# Patient Record
Sex: Female | Born: 1960 | Race: White | Hispanic: No | Marital: Married | Smoking: Never smoker
Health system: Southern US, Community
[De-identification: ages and names within clinical notes are randomized; demographics above are authoritative.]

## PROBLEM LIST (undated history)

## (undated) DIAGNOSIS — F419 Anxiety disorder, unspecified: Secondary | ICD-10-CM

## (undated) DIAGNOSIS — C4442 Squamous cell carcinoma of skin of scalp and neck: Secondary | ICD-10-CM

## (undated) DIAGNOSIS — C4491 Basal cell carcinoma of skin, unspecified: Secondary | ICD-10-CM

## (undated) DIAGNOSIS — Z803 Family history of malignant neoplasm of breast: Secondary | ICD-10-CM

## (undated) DIAGNOSIS — R87619 Unspecified abnormal cytological findings in specimens from cervix uteri: Secondary | ICD-10-CM

## (undated) DIAGNOSIS — C439 Malignant melanoma of skin, unspecified: Secondary | ICD-10-CM

## (undated) HISTORY — PX: SKIN CANCER EXCISION: SHX779

## (undated) HISTORY — PX: COLONOSCOPY: SHX174

## (undated) HISTORY — PX: ESOPHAGOGASTRODUODENOSCOPY ENDOSCOPY: SHX5814

## (undated) HISTORY — DX: Squamous cell carcinoma of skin of scalp and neck: C44.42

## (undated) HISTORY — DX: Malignant melanoma of skin, unspecified: C43.9

## (undated) HISTORY — DX: Basal cell carcinoma of skin, unspecified: C44.91

## (undated) HISTORY — PX: WISDOM TOOTH EXTRACTION: SHX21

## (undated) HISTORY — DX: Family history of malignant neoplasm of breast: Z80.3

## (undated) HISTORY — DX: Unspecified abnormal cytological findings in specimens from cervix uteri: R87.619

## (undated) HISTORY — PX: BREAST BIOPSY: SHX20

## (undated) HISTORY — PX: COLPOSCOPY: SHX161

## (undated) HISTORY — DX: Anxiety disorder, unspecified: F41.9

---

## 2004-08-30 ENCOUNTER — Ambulatory Visit: Payer: Self-pay | Admitting: Unknown Physician Specialty

## 2004-09-28 ENCOUNTER — Ambulatory Visit: Payer: Self-pay | Admitting: Gastroenterology

## 2005-09-01 ENCOUNTER — Ambulatory Visit: Payer: Self-pay | Admitting: Unknown Physician Specialty

## 2006-10-16 ENCOUNTER — Ambulatory Visit: Payer: Self-pay | Admitting: Unknown Physician Specialty

## 2007-10-30 ENCOUNTER — Ambulatory Visit: Payer: Self-pay | Admitting: Unknown Physician Specialty

## 2008-12-04 ENCOUNTER — Ambulatory Visit: Payer: Self-pay | Admitting: Unknown Physician Specialty

## 2009-12-23 ENCOUNTER — Ambulatory Visit: Payer: Self-pay | Admitting: Unknown Physician Specialty

## 2013-07-16 ENCOUNTER — Emergency Department: Payer: Self-pay | Admitting: Emergency Medicine

## 2013-07-16 LAB — COMPREHENSIVE METABOLIC PANEL
ALT: 15 U/L (ref 12–78)
Albumin: 4 g/dL (ref 3.4–5.0)
Alkaline Phosphatase: 82 U/L
Anion Gap: 5 — ABNORMAL LOW (ref 7–16)
BILIRUBIN TOTAL: 0.5 mg/dL (ref 0.2–1.0)
BUN: 14 mg/dL (ref 7–18)
CREATININE: 0.89 mg/dL (ref 0.60–1.30)
Calcium, Total: 9.4 mg/dL (ref 8.5–10.1)
Chloride: 107 mmol/L (ref 98–107)
Co2: 27 mmol/L (ref 21–32)
EGFR (Non-African Amer.): >60
Glucose: 112 mg/dL — ABNORMAL HIGH (ref 65–99)
OSMOLALITY: 279 (ref 275–301)
POTASSIUM: 3.8 mmol/L (ref 3.5–5.1)
SGOT(AST): 15 U/L (ref 15–37)
SODIUM: 139 mmol/L (ref 136–145)
TOTAL PROTEIN: 7.3 g/dL (ref 6.4–8.2)

## 2013-07-16 LAB — CBC WITH DIFFERENTIAL/PLATELET
BASOS ABS: 0.1 10*3/uL (ref 0.0–0.1)
BASOS PCT: 0.4 %
EOS ABS: 0.1 10*3/uL (ref 0.0–0.7)
Eosinophil %: 0.5 %
HCT: 44.4 % (ref 35.0–47.0)
HGB: 14.6 g/dL (ref 12.0–16.0)
Lymphocyte #: 2 10*3/uL (ref 1.0–3.6)
Lymphocyte %: 15.1 %
MCH: 30.2 pg (ref 26.0–34.0)
MCHC: 32.9 g/dL (ref 32.0–36.0)
MCV: 92 fL (ref 80–100)
Monocyte #: 1.2 x10 3/mm — ABNORMAL HIGH (ref 0.2–0.9)
Monocyte %: 9.2 %
Neutrophil #: 9.8 10*3/uL — ABNORMAL HIGH (ref 1.4–6.5)
Neutrophil %: 74.8 %
Platelet: 205 10*3/uL (ref 150–440)
RBC: 4.84 10*6/uL (ref 3.80–5.20)
RDW: 13.6 % (ref 11.5–14.5)
WBC: 13.1 10*3/uL — AB (ref 3.6–11.0)

## 2013-07-16 LAB — URINALYSIS, COMPLETE
Bacteria: NONE SEEN
Bilirubin,UR: NEGATIVE
Blood: NEGATIVE
Glucose,UR: NEGATIVE mg/dL (ref 0–75)
KETONE: NEGATIVE
Leukocyte Esterase: NEGATIVE
NITRITE: NEGATIVE
Ph: 6 (ref 4.5–8.0)
Protein: NEGATIVE
RBC,UR: <1 /HPF (ref 0–5)
SPECIFIC GRAVITY: 1.01 (ref 1.003–1.030)
Squamous Epithelial: 1
WBC UR: NONE SEEN /HPF (ref 0–5)

## 2013-07-16 LAB — LIPASE, BLOOD: Lipase: 194 U/L (ref 73–393)

## 2013-07-16 LAB — TROPONIN I: Troponin-I: <0.02 ng/mL

## 2013-07-17 ENCOUNTER — Emergency Department: Payer: Self-pay | Admitting: Emergency Medicine

## 2013-07-17 LAB — CBC WITH DIFFERENTIAL/PLATELET
BASOS ABS: 0.1 10*3/uL (ref 0.0–0.1)
BASOS PCT: 0.6 %
EOS PCT: 0.3 %
Eosinophil #: 0 10*3/uL (ref 0.0–0.7)
HCT: 46.6 % (ref 35.0–47.0)
HGB: 15.3 g/dL (ref 12.0–16.0)
LYMPHS ABS: 2.5 10*3/uL (ref 1.0–3.6)
LYMPHS PCT: 26.8 %
MCH: 30.6 pg (ref 26.0–34.0)
MCHC: 32.7 g/dL (ref 32.0–36.0)
MCV: 94 fL (ref 80–100)
MONOS PCT: 10 %
Monocyte #: 0.9 x10 3/mm (ref 0.2–0.9)
NEUTROS ABS: 5.8 10*3/uL (ref 1.4–6.5)
Neutrophil %: 62.3 %
PLATELETS: 190 10*3/uL (ref 150–440)
RBC: 4.98 10*6/uL (ref 3.80–5.20)
RDW: 13.8 % (ref 11.5–14.5)
WBC: 9.2 10*3/uL (ref 3.6–11.0)

## 2013-07-17 LAB — COMPREHENSIVE METABOLIC PANEL
Albumin: 3.9 g/dL (ref 3.4–5.0)
Alkaline Phosphatase: 76 U/L
Anion Gap: 8 (ref 7–16)
BUN: 9 mg/dL (ref 7–18)
Bilirubin,Total: 0.9 mg/dL (ref 0.2–1.0)
Calcium, Total: 9.1 mg/dL (ref 8.5–10.1)
Chloride: 109 mmol/L — ABNORMAL HIGH (ref 98–107)
Co2: 22 mmol/L (ref 21–32)
Creatinine: 0.8 mg/dL (ref 0.60–1.30)
EGFR (African American): >60
Glucose: 80 mg/dL (ref 65–99)
OSMOLALITY: 275 (ref 275–301)
Potassium: 3.8 mmol/L (ref 3.5–5.1)
SGOT(AST): 27 U/L (ref 15–37)
SGPT (ALT): 13 U/L (ref 12–78)
SODIUM: 139 mmol/L (ref 136–145)
Total Protein: 7.4 g/dL (ref 6.4–8.2)

## 2013-07-17 LAB — LIPASE, BLOOD: Lipase: 134 U/L (ref 73–393)

## 2013-07-17 LAB — TROPONIN I

## 2013-07-18 ENCOUNTER — Emergency Department: Payer: Self-pay | Admitting: Emergency Medicine

## 2013-07-18 LAB — CBC
HCT: 41.8 % (ref 35.0–47.0)
HGB: 14.5 g/dL (ref 12.0–16.0)
MCH: 32.3 pg (ref 26.0–34.0)
MCHC: 34.7 g/dL (ref 32.0–36.0)
MCV: 93 fL (ref 80–100)
PLATELETS: 192 10*3/uL (ref 150–440)
RBC: 4.49 10*6/uL (ref 3.80–5.20)
RDW: 13.7 % (ref 11.5–14.5)
WBC: 7.5 10*3/uL (ref 3.6–11.0)

## 2013-07-18 LAB — COMPREHENSIVE METABOLIC PANEL
ALBUMIN: 3.8 g/dL (ref 3.4–5.0)
ALT: 16 U/L (ref 12–78)
Alkaline Phosphatase: 73 U/L
Anion Gap: 6 — ABNORMAL LOW (ref 7–16)
BILIRUBIN TOTAL: 0.9 mg/dL (ref 0.2–1.0)
BUN: 11 mg/dL (ref 7–18)
Calcium, Total: 8.5 mg/dL (ref 8.5–10.1)
Chloride: 106 mmol/L (ref 98–107)
Co2: 28 mmol/L (ref 21–32)
Creatinine: 1.05 mg/dL (ref 0.60–1.30)
EGFR (African American): >60
EGFR (Non-African Amer.): >60
GLUCOSE: 71 mg/dL (ref 65–99)
OSMOLALITY: 277 (ref 275–301)
POTASSIUM: 4 mmol/L (ref 3.5–5.1)
SGOT(AST): 23 U/L (ref 15–37)
Sodium: 140 mmol/L (ref 136–145)
Total Protein: 7 g/dL (ref 6.4–8.2)

## 2013-07-18 LAB — LIPASE, BLOOD: Lipase: 163 U/L (ref 73–393)

## 2013-07-24 ENCOUNTER — Ambulatory Visit: Payer: Self-pay | Admitting: Gastroenterology

## 2013-08-05 ENCOUNTER — Ambulatory Visit: Payer: Self-pay | Admitting: Gastroenterology

## 2016-03-27 ENCOUNTER — Ambulatory Visit (INDEPENDENT_AMBULATORY_CARE_PROVIDER_SITE_OTHER): Payer: BC Managed Care – PPO | Admitting: Certified Nurse Midwife

## 2016-03-27 ENCOUNTER — Encounter: Payer: Self-pay | Admitting: Certified Nurse Midwife

## 2016-03-27 VITALS — BP 110/70 | HR 64 | Ht 65.0 in | Wt 173.0 lb

## 2016-03-27 DIAGNOSIS — Z13220 Encounter for screening for lipoid disorders: Secondary | ICD-10-CM | POA: Diagnosis not present

## 2016-03-27 DIAGNOSIS — C439 Malignant melanoma of skin, unspecified: Secondary | ICD-10-CM | POA: Insufficient documentation

## 2016-03-27 DIAGNOSIS — N951 Menopausal and female climacteric states: Secondary | ICD-10-CM | POA: Insufficient documentation

## 2016-03-27 DIAGNOSIS — Z124 Encounter for screening for malignant neoplasm of cervix: Secondary | ICD-10-CM

## 2016-03-27 DIAGNOSIS — Z01419 Encounter for gynecological examination (general) (routine) without abnormal findings: Secondary | ICD-10-CM

## 2016-03-27 DIAGNOSIS — F419 Anxiety disorder, unspecified: Secondary | ICD-10-CM | POA: Insufficient documentation

## 2016-03-27 DIAGNOSIS — C4491 Basal cell carcinoma of skin, unspecified: Secondary | ICD-10-CM | POA: Insufficient documentation

## 2016-03-27 MED ORDER — ALPRAZOLAM 0.25 MG PO TABS: 0.2500 mg | ORAL_TABLET | Freq: Three times a day (TID) | ORAL | 0 refills | Status: DC | PRN

## 2016-03-27 MED ORDER — ESCITALOPRAM OXALATE 5 MG PO TABS: 5.0000 mg | ORAL_TABLET | Freq: Every day | ORAL | 3 refills | Status: DC

## 2016-03-27 NOTE — Progress Notes (Addendum)
Gynecology Annual Exam  PCP: No PCP Per Patient  Chief Complaint:  Chief Complaint  Patient presents with  . Gynecologic Exam    History of Present Illness: Patient is a 55 y.o. [id number] presents for annual exam. The patient has no gyn complaints today. Her hot flashes as well as her anxiety are treated with a 63mm Lexapro. She rarely takes Xanax.  Her menses are infrequent, about every 3 months, and last 1-2 days. LMP: Patient's last menstrual period was 11/24/2015 (approximate).  The patient is sexually active. She currently uses Condoms for contraception. Her last annual exam was 01/28/2015. .Marland KitchenHer  last pap: was 01/20/2014 and was NIL. and last mammogram: 01/28/2015 was negative  The patient has regular exercise: has just recently started back exercising after a 7 mos hiatus.. She has gained 11# in the last year. There is a family history of breast cancer in her mother at age 56 Genetic testing has not been done. She does do self breast exams monthly. She does not smoke. She drinks socially. She does not use illicit drugs.She does get adequate calcium in her diet. Last cholesterol panel was drawn 2016 and total cholesterol and LDL was borderline. She desires testing today    Review of Systems: Review of Systems  Constitutional: Negative for chills, fever and weight loss.       Positive for weight gain and occasional hot flashes  HENT: Negative for congestion, sinus pain and sore throat.   Eyes: Negative for blurred vision and pain.  Respiratory: Negative for hemoptysis, shortness of breath and wheezing.   Cardiovascular: Negative for chest pain, palpitations and leg swelling.  Gastrointestinal: Negative for abdominal pain, blood in stool, diarrhea, heartburn, nausea and vomiting.  Genitourinary: Negative for dysuria, frequency, hematuria and urgency.  Musculoskeletal: Negative for back pain, joint pain and myalgias.  Skin: Negative for itching and rash.  Neurological: Negative  for dizziness, tingling and headaches.  Endo/Heme/Allergies: Negative for environmental allergies and polydipsia. Does not bruise/bleed easily.       Negative for hirsutism   Psychiatric/Behavioral: Negative for depression. The patient is not nervous/anxious and does not have insomnia.      Past Medical History:  Past Medical History:  Diagnosis Date  . Abnormal Pap smear of cervix    +HPV  . Anxiety   . Basal cell carcinoma    chest  . Family history of breast cancer   . [place]    [place]    Past Surgical History:  Past Surgical History:  Procedure Laterality Date  . COLONOSCOPY  2006/2015  . COLPOSCOPY  2001/2007  . ESOPHAGOGASTRODUODENOSCOPY ENDOSCOPY     for abd pain and N/V  . WISDOM TOOTH EXTRACTION     top two    Gynecologic History:  History of abnormal Pap smears in 2001 and 2007. Colposcopies revealed HPV changes.  Obstetric History: [id number] Family History:  Family History  Problem Relation Age of Onset  . Breast cancer Mother 717   met to liver/pancreas  . Diabetes Father   . Heart failure Father   . Stroke Maternal Uncle   . Alzheimer's disease Maternal Grandmother     Social History:  Social History   Social History  . Marital status: Married    Spouse name: N/A  . Number of children: 2  . Years of education: N/A   Occupational History  . Bookkeeper    Social History Main Topics  . Smoking status: Never Smoker  .  Smokeless tobacco: Never Used  . Alcohol use Yes  . Drug use: No  . Sexual activity: Yes   Other Topics Concern  . Not on file   Social History Narrative  . No narrative on file    Allergies:  No Known Allergies  Medications: Prior to Admission medications   Medication Sig Start Date End Date Taking? Authorizing Provider  escitalopram (LEXAPRO) 5 MG tablet Take 5 mg by mouth daily.   Yes Historical Provider, MD  ALPRAZolam (XANAX) 0.25 MG tablet Take 0.25 mg by mouth 3 (three) times daily as needed for  anxiety.    Historical Provider, MD    Physical Exam Vitals: Blood pressure 110/70, pulse 64, height 5' 5"  (1.651 m), weight 173 lb (78.5 kg), last menstrual period 11/24/2015.  General: NAD HEENT: normocephalic, anicteric Thyroid: no enlargement, no palpable nodules Pulmonary: No increased work of breathing, CTAB Cardiovascular: RRR without murmur  Breast: Breast symmetrical, no tenderness, no palpable nodules or masses, no skin or nipple retraction present, no nipple discharge.  No axillary or infraclavicular, or supraclavicular lymphadenopathy. Abdomen:, soft, non-tender, non-distended.  Umbilicus without lesions.  No hepatomegaly,  or masses palpable. No evidence of hernia  Genitourinary:  External: Normal external female genitalia.  Normal urethral meatus, normal  Bartholin's and Skene's glands.    Vagina: Normal vaginal mucosa, no evidence of prolapse.    Cervix: Grossly normal in appearance, no bleeding  Uterus: AV, NSSC, mobile, normal contour.  No CMT  Adnexa: ovaries non-enlarged, no adnexal masses  Rectal: deferred  Lymphatic: no evidence of inguinal lymphadenopathy Extremities: no edema, erythema, or tenderness Neurologic: Grossly intact Psychiatric: mood appropriate, affect full  Assessment and Plan 1. Encounter for gynecological examination (general) (routine) without abnormal findings  Discussed perimenopausal bleeding pattern being normal. Advised to continue contraception until no menses x1 year. m. .  2. Perimenopausal vasomotor symptoms Continue Lexapro for hot flashes  3. Anxiety Continue Lexapro  4. Screening for hyperlipidemia  - Lipid panel  5. Screening for cervical cancer  - Pap IG and HPV (high risk) DNA detection  6. Screening for colon cancer: up to date on colonoscopy  7. Screening for breast cancer- schedule screening mammogram. Continue annual mammograms.   Dalia Heading, CNM

## 2016-03-28 LAB — LIPID PANEL
CHOL/HDL RATIO: 3.8 ratio (ref 0.0–4.4)
Cholesterol, Total: 218 mg/dL — ABNORMAL HIGH (ref 100–199)
HDL: 57 mg/dL (ref >39)
LDL Calculated: 138 mg/dL — ABNORMAL HIGH (ref 0–99)
TRIGLYCERIDES: 113 mg/dL (ref 0–149)
VLDL Cholesterol Cal: 23 mg/dL (ref 5–40)

## 2016-03-29 LAB — PAP IG AND HPV HIGH-RISK
HPV, HIGH-RISK: NEGATIVE
PAP SMEAR COMMENT: 0

## 2016-04-10 ENCOUNTER — Encounter: Payer: Self-pay | Admitting: Certified Nurse Midwife

## 2017-03-20 ENCOUNTER — Other Ambulatory Visit: Payer: Self-pay | Admitting: Certified Nurse Midwife

## 2017-06-16 ENCOUNTER — Other Ambulatory Visit: Payer: Self-pay | Admitting: Certified Nurse Midwife

## 2017-09-14 ENCOUNTER — Other Ambulatory Visit: Payer: Self-pay | Admitting: Certified Nurse Midwife

## 2017-11-23 HISTORY — PX: MOHS SURGERY: SUR867

## 2017-12-10 ENCOUNTER — Other Ambulatory Visit: Payer: Self-pay | Admitting: Certified Nurse Midwife

## 2018-01-30 ENCOUNTER — Other Ambulatory Visit: Payer: Self-pay | Admitting: Certified Nurse Midwife

## 2018-03-12 NOTE — Progress Notes (Signed)
Gynecology Annual Exam  PCP: Patient, No Pcp Per  Chief Complaint:  Chief Complaint  Patient presents with  . Gynecologic Exam    History of Present Illness: Patient is a 58 y.o. [id number]  WF whopresents for her annual exam. The patient has no gyn complaints today. Her hot flashes as well as her anxiety are treated with a 35mm Lexapro. She rarely takes Xanax.   Her LMP was about a year ago.The patient is sexually active and is not needing contraceptive since she is menopausal.  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 Her past medical history is significant for melanoma and basal cell carcinoma also.  Her  last pap: was 03/27/2016 and was NIL/neg HRHPV,. and last mammogram: 01/28/2015 was negative  She had a colonoscopy 08/05/2013 that was normal and her next one is due 2025.  The patient has regular exercise: has just recently started back doing yoga..  There is a family history of breast cancer in her mother at age 58 Genetic testing has not been done. She does do self breast exams monthly. Has had intermittent pain in the upper outer right breast/ chest. She does not smoke. She drinks socially. She does not use illicit drugs.She does get adequate calcium in her diet. Last cholesterol panel was drawn 2018 and total cholesterol and LDL were borderline. She desires repeat testing today.    Review of Systems: Review of Systems  Constitutional: Negative for chills, fever and weight loss.  HENT: Negative for congestion, sinus pain and sore throat.   Eyes: Negative for blurred vision and pain.  Respiratory: Negative for hemoptysis, shortness of breath and wheezing.   Cardiovascular: Negative for chest pain, palpitations and leg swelling.  Gastrointestinal: Negative for abdominal pain, blood in stool, diarrhea, heartburn, nausea and vomiting.  Genitourinary: Negative for dysuria, frequency, hematuria and urgency.    Musculoskeletal: Negative for back pain, joint pain and myalgias.  Skin: Negative for itching and rash.  Neurological: Negative for dizziness, tingling and headaches.  Endo/Heme/Allergies: Negative for environmental allergies and polydipsia. Does not bruise/bleed easily.       Positive for hot flashes   Psychiatric/Behavioral: Negative for depression. The patient is not nervous/anxious and does not have insomnia.      Past Medical History:  Past Medical History:  Diagnosis Date  . Abnormal Pap smear of cervix    +HPV  . Anxiety   . Basal cell carcinoma    Chest  . Family history of breast cancer   . [place]    [place]  . 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 COLONOSCOPY  2006/2015  . COLPOSCOPY  2001/2007  . ESOPHAGOGASTRODUODENOSCOPY ENDOSCOPY     for abd pain and N/V  . MOHS SURGERY  11/2017   scalp-squamous cell cancer  . SKIN CANCER EXCISION  2012,2014,2019   Right arm, Chest and top of head   . WISDOM TOOTH EXTRACTION     top two    Gynecologic History:  History of abnormal Pap smears in 2001 and 2007. Colposcopies revealed HPV changes.  Obstetric History: [id number] Family History:  Family History  Problem Relation Age of Onset  . Breast cancer Mother 726      met to liver/pancreas  . Diabetes Father   . Heart  failure Father   . Skin cancer Father   . Stroke Maternal Uncle   . Alzheimer's disease Maternal Grandmother     Social History:  Social History   Socioeconomic History  . Marital status: Married    Spouse name: Elta Guadeloupe  . Number of children: 2  . Years of education: Not on file  . Highest education level: Not on file  Occupational History  . Occupation: Medical laboratory scientific officer  . Financial resource strain: Not on file  . Food insecurity:    Worry: Not on file    Inability: Not on file  . Transportation needs:    Medical: Not on file     Non-medical: Not on file  Tobacco Use  . Smoking status: Never Smoker  . Smokeless tobacco: Never Used  Substance and Sexual Activity  . Alcohol use: Yes  . Drug use: No  . Sexual activity: Yes    Birth control/protection: Post-menopausal  Lifestyle  . Physical activity:    Days per week: Not on file    Minutes per session: Not on file  . Stress: Not on file  Relationships  . Social connections:    Talks on phone: Not on file    Gets together: Not on file    Attends religious service: Not on file    Active member of club or organization: Not on file    Attends meetings of clubs or organizations: Not on file    Relationship status: Not on file  . Intimate partner violence:    Fear of current or ex partner: Not on file    Emotionally abused: Not on file    Physically abused: Not on file    Forced sexual activity: Not on file  Other Topics Concern  . Not on file  Social History Narrative  . Not on file    Allergies:  No Known Allergies  Medications: Prior to Admission medications   Medication Sig Start Date End Date Taking? Authorizing Provider  escitalopram (LEXAPRO) 5 MG tablet Take 5 mg by mouth daily.   Yes Historical Provider, MD  ALPRAZolam (XANAX) 0.25 MG tablet Take 0.25 mg by mouth 3 (three) times daily as needed for anxiety.    Historical Provider, MD    Physical Exam Vitals: BP 110/60   Pulse 64   Ht 5' 5"  (1.651 m)   Wt 172 lb (78 kg)   LMP 11/24/2015 (Approximate)   BMI 28.62 kg/m  General: WF in  NAD HEENT: normocephalic, anicteric Thyroid: no enlargement, no palpable nodules Pulmonary: No increased work of breathing, CTAB Cardiovascular: RRR without murmur  Breast: Breast symmetrical, no tenderness, no palpable nodules or masses, no skin or nipple retraction present, no nipple discharge.  No axillary or infraclavicular, or supraclavicular lymphadenopathy. Abdomen:, soft, non-tender, non-distended.  Umbilicus without lesions.  No hepatomegaly,   or masses palpable. No evidence of hernia  Genitourinary:  External: Normal external female genitalia.  Normal urethral meatus, normal  Bartholin's and Skene's glands.    Vagina: Normal vaginal mucosa, no evidence of prolapse.    Cervix: Grossly normal in appearance, no bleeding, stenotic  Uterus: AV, NSSC, mobile, normal contour, NT  Adnexa: ovaries non-enlarged, no adnexal masses  Rectal: deferred  Lymphatic: no evidence of inguinal lymphadenopathy Extremities: no edema, erythema, or tenderness Neurologic: Grossly intact Psychiatric: mood appropriate, affect full  Assessment and Plan 1. Encounter for gynecological examination (general) (routine) without abnormal findings   2. Menopausal vasomotor symptoms Continue Lexapro for hot flashes and  anxiety  3. Anxiety Continue Lexapro  4. Routine screening for health maintanence  - Lipid panel, hemoglobin A1C, hepatitis C antibody  5. Screening for cervical cancer  - Pap done  6. Screening for colon cancer: up to date on colonoscopy  7. Screening for breast cancer- schedule screening mammogram. Continue annual mammograms.  8) Osteoporosis prevention: Discussed calcium and vitamin D3 requirements and the role of exercise in preventing osteoporosis.  9) RTO in 1 year for annual.   Dalia Heading, CNM

## 2018-03-13 ENCOUNTER — Encounter: Payer: Self-pay | Admitting: Certified Nurse Midwife

## 2018-03-13 ENCOUNTER — Other Ambulatory Visit (HOSPITAL_COMMUNITY)
Admission: RE | Admit: 2018-03-13 | Discharge: 2018-03-13 | Disposition: A | Discharge status: Home / Self Care | Payer: BC Managed Care – PPO | Source: Ambulatory Visit | Attending: Certified Nurse Midwife | Admitting: Certified Nurse Midwife

## 2018-03-13 ENCOUNTER — Ambulatory Visit (INDEPENDENT_AMBULATORY_CARE_PROVIDER_SITE_OTHER): Payer: BC Managed Care – PPO | Admitting: Certified Nurse Midwife

## 2018-03-13 VITALS — BP 110/60 | HR 64 | Ht 65.0 in | Wt 172.0 lb

## 2018-03-13 DIAGNOSIS — Z01419 Encounter for gynecological examination (general) (routine) without abnormal findings: Secondary | ICD-10-CM | POA: Diagnosis present

## 2018-03-13 DIAGNOSIS — Z124 Encounter for screening for malignant neoplasm of cervix: Secondary | ICD-10-CM | POA: Diagnosis present

## 2018-03-13 DIAGNOSIS — Z1239 Encounter for other screening for malignant neoplasm of breast: Secondary | ICD-10-CM

## 2018-03-13 DIAGNOSIS — E785 Hyperlipidemia, unspecified: Secondary | ICD-10-CM

## 2018-03-13 DIAGNOSIS — Z131 Encounter for screening for diabetes mellitus: Secondary | ICD-10-CM

## 2018-03-13 DIAGNOSIS — Z1159 Encounter for screening for other viral diseases: Secondary | ICD-10-CM

## 2018-03-13 NOTE — Patient Instructions (Signed)
Preventing Osteoporosis, Adult  Osteoporosis is a condition that causes the bones to get weaker. With osteoporosis, the bones become thinner, and the normal spaces in bone tissue become larger. This can make the bones weak and cause them to break more easily. People who have osteoporosis are more likely to break their wrist, spine, or hip. Even a minor accident or injury can be enough to break weak bones.  Osteoporosis can occur with aging. Your body constantly replaces old bone tissue with new tissue. As you get older, you may lose bone tissue more quickly, or it may be replaced more slowly. Osteoporosis is more likely to develop if you have poor nutrition or do not get enough calcium or vitamin D. Other lifestyle factors can also play a role. By making some diet and lifestyle changes, you can help to keep your bones healthy and help to prevent osteoporosis.  What nutrition changes can be made?  Nutrition plays an important role in maintaining healthy, strong bones.  · Make sure you get enough calcium every day from food or from calcium supplements.  ? If you are age 50 or younger, aim to get 1,000 mg of calcium every day.  ? If you are older than age 50, aim to get 1,200 mg of calcium every day.  · Try to get enough vitamin D every day.  ? If you are age 70 or younger, aim to get 600 international units (IU) every day.  ? If you are older than age 70, aim to get 800 international units every day.  · Follow a healthy diet. Eat plenty of foods that contain calcium and vitamin D.  ? Calcium is in milk, cheese, yogurt, and other dairy products. Some fish and vegetables are also good sources of calcium. Many foods such as cereals and breads have had calcium added to them (are fortified). Check nutrition labels to see how much calcium is in a food or drink.  ? Foods that contain vitamin D include milk, cereals, salmon, and tuna. Your body also makes vitamin D when you are out in the sun. Bare skin exposure to the sun on  your face, arms, legs, or back for no more than 30 minutes a day, 2 times per week is more than enough. Beyond that, it is important to use sunblock to protect your skin from sunburn, which increases your risk for skin cancer.  What lifestyle changes can be made?  Making changes in your everyday life can also play an important role in preventing osteoporosis.  · Stay active and get exercise every day. Ask your health care provider what types of exercise are best for you.  · Do not use any products that contain nicotine or tobacco, such as cigarettes and e-cigarettes. If you need help quitting, ask your health care provider.  · Limit alcohol intake to no more than 1 drink a day for nonpregnant women and 2 drinks a day for men. One drink equals 12 oz of beer, 5 oz of wine, or 1½ oz of hard liquor.  Why are these changes important?  Making these nutrition and lifestyle changes can:  · Help you develop and maintain healthy, strong bones.  · Prevent loss of bone mass and the problems that are caused by that loss, such as broken bones and delayed healing.  · Make you feel better mentally and physically.  What can happen if changes are not made?  Problems that can result from osteoporosis can be very serious. These   may include:  · A higher risk of broken bones that are painful and do not heal well.  · Physical malformations, such as a collapsed spine or a hunched back.  · Problems with movement.  Where to find support  If you need help making changes to prevent osteoporosis, talk with your health care provider. You can ask for a referral to a diet and nutrition specialist (dietitian) and a physical therapist.  Where to find more information  Learn more about osteoporosis from:  · NIH Osteoporosis and Related Bone Diseases National Resource Center: www.niams.nih.gov/health_info/bone/osteoporosis/osteoporosis_ff.asp  · U.S. Office on Women’s Health:  www.womenshealth.gov/publications/our-publications/fact-sheet/osteoporosis.html  · National Osteoporosis Foundation: www.nof.org/patients/what-is-osteoporosis/  Summary  · Osteoporosis is a condition that causes weak bones that are more likely to break.  · Eating a healthy diet and making sure you get enough calcium and vitamin D can help prevent osteoporosis.  · Other ways to reduce your risk of osteoporosis include getting regular exercise and avoiding alcohol and products that contain nicotine or tobacco.  This information is not intended to replace advice given to you by your health care provider. Make sure you discuss any questions you have with your health care provider.  Document Released: 01/24/2015 Document Revised: 10/17/2016 Document Reviewed: 09/20/2015  Elsevier Interactive Patient Education © 2019 Elsevier Inc.

## 2018-03-14 LAB — HEMOGLOBIN A1C
Est. average glucose Bld gHb Est-mCnc: 108 mg/dL
HEMOGLOBIN A1C: 5.4 % (ref 4.8–5.6)

## 2018-03-14 LAB — CYTOLOGY - PAP
Adequacy: ABSENT
Diagnosis: NEGATIVE

## 2018-03-14 LAB — LIPID PANEL
CHOL/HDL RATIO: 4.3 ratio (ref 0.0–4.4)
Cholesterol, Total: 234 mg/dL — ABNORMAL HIGH (ref 100–199)
HDL: 54 mg/dL (ref >39)
LDL CALC: 152 mg/dL — AB (ref 0–99)
Triglycerides: 142 mg/dL (ref 0–149)
VLDL CHOLESTEROL CAL: 28 mg/dL (ref 5–40)

## 2018-03-14 LAB — HEPATITIS C ANTIBODY: Hep C Virus Ab: <0.1 s/co ratio (ref 0.0–0.9)

## 2018-03-15 ENCOUNTER — Encounter: Payer: Self-pay | Admitting: Certified Nurse Midwife

## 2018-03-19 ENCOUNTER — Encounter: Payer: Self-pay | Admitting: Certified Nurse Midwife

## 2018-08-08 ENCOUNTER — Ambulatory Visit
Admission: RE | Admit: 2018-08-08 | Discharge: 2018-08-08 | Disposition: A | Discharge status: Home / Self Care | Payer: BC Managed Care – PPO | Source: Ambulatory Visit | Attending: Certified Nurse Midwife | Admitting: Certified Nurse Midwife

## 2018-08-08 ENCOUNTER — Other Ambulatory Visit: Payer: Self-pay

## 2018-08-08 DIAGNOSIS — Z1239 Encounter for other screening for malignant neoplasm of breast: Secondary | ICD-10-CM

## 2018-08-08 DIAGNOSIS — Z1231 Encounter for screening mammogram for malignant neoplasm of breast: Secondary | ICD-10-CM | POA: Diagnosis present

## 2019-01-12 NOTE — Progress Notes (Signed)
Virtual Visit via Telephone Note  I connected with Mallory Davis on 01/13/19 at 10:50 AM EST by telephone and verified that I am speaking with the correct person using two identifiers.   I discussed the limitations, risks, security and privacy concerns of performing an evaluation and management service by telephone and the availability of in person appointments. I also discussed with the patient that there may be a patient responsible charge related to this service. The patient expressed understanding and agreed to proceed. Location of pt: home Location of provider: [facility] Name of [place] for history intake: Drenda Freeze    Chief Complaint  Patient presents with  . Menopause  . Anxiety    HPI:       [name] is a 58 y.o. [id number] who LMP was Patient's last menstrual period was 11/24/2015 (approximate)., presents today for Rx RF of lexapro. Was taking 5 mg for anxiety and vasomotor sx for many yrs with relief. Weaned off it this past spring and was doing well until recently. Now having anxiety, particularly with waking in AM and increased vasomotor sx. Has some overlapping depression sx but doesn't feel depressed. Just frustrated with covid. Had leftover xanax and has been taking past 12 days (althoug didn't take today because feeling better) with some relief. Restarted leftover lexapro at 10 mg dose for past 6 days and starting to have some improvement. Had leftover meds and it was hard to get office appt. Pt needs Rx RF. FH anxiety/dad is bipolar. Pt is postmenopausal. Annual due 2/21.    Past Medical History:  Diagnosis Date  . Abnormal Pap smear of cervix    +HPV  . Anxiety   . Basal cell carcinoma    Chest  . Family history of breast cancer   . Melanoma ([place])    Right arm  . 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 COLONOSCOPY  2006/2015  . COLPOSCOPY  2001/2007  .  ESOPHAGOGASTRODUODENOSCOPY ENDOSCOPY     for abd pain and N/V  . MOHS SURGERY  11/2017   scalp-squamous cell cancer  . SKIN CANCER EXCISION  2012,2014,2019   Right arm, Chest and top of head   . WISDOM TOOTH EXTRACTION     top two    Family History  Problem Relation Age of Onset  . Breast cancer Mother 30       met to liver/pancreas  . Diabetes Father   . Heart failure Father   . Skin cancer Father   . Stroke Maternal Uncle   . Alzheimer's disease Maternal Grandmother      Current Outpatient Medications:  .  escitalopram (LEXAPRO) 5 MG tablet, Take 1 tablet (5 mg total) by mouth daily. Take 1-2 tablets daily, Disp: 180 tablet, Rfl: 0 .  ALPRAZolam (XANAX) 0.25 MG tablet, Take 1 tablet (0.25 mg total) by mouth 3 (three) times daily as needed for anxiety., Disp: 30 tablet, Rfl: 0   OBJECTIVE:    Physical Exam Pulmonary:     Effort: Pulmonary effort is normal.  Neurological:     General: No focal deficit present.     Mental Status: She is alert and oriented to person, place, and time.  Psychiatric:        Mood and Affect: Mood normal.        Behavior: Behavior normal.  Thought Content: Thought content normal.        Judgment: Judgment normal.      GAD 7 : Generalized Anxiety Score 01/13/2019  Nervous, Anxious, on Edge 3  Control/stop worrying 2  Worry too much - different things 3  Trouble relaxing 2  Restless 2  Easily annoyed or irritable 2  Afraid - awful might happen 2  Total GAD 7 Score 16  Anxiety Difficulty Very difficult   Depression screen PHQ 2/9 01/13/2019  Decreased Interest 2  Down, Depressed, Hopeless 1  PHQ - 2 Score 3  Altered sleeping 2  Tired, decreased energy 1  Change in appetite 2  Feeling bad or failure about yourself  1  Trouble concentrating 2  Moving slowly or fidgety/restless 0  Suicidal thoughts 0  PHQ-9 Score 11  Difficult doing work/chores Somewhat difficult    Assessment/Plan: Anxiety - Plan: escitalopram  (LEXAPRO) 5 MG tablet, ALPRAZolam (XANAX) 0.25 MG tablet  Perimenopausal vasomotor symptoms - Plan: escitalopram (LEXAPRO) 5 MG tablet  Pt doing better after lexapro 10 mg restart. Cont 10 mg dose for now. Re-eval at 6-8 wks. If doing well, can wean down to 5 mg dose to see if controls sx, like it did in past. Rx RF eRxd. Rx RF xanax to take sparingly but prn. F/u at 2/21 annual/sooner prn.    Meds ordered this encounter  Medications  . escitalopram (LEXAPRO) 5 MG tablet    Sig: Take 1 tablet (5 mg total) by mouth daily. Take 1-2 tablets daily    Dispense:  180 tablet    Refill:  0    Order Specific Question:   Supervising Provider    Answer:   Gae Dry [id number]  . ALPRAZolam (XANAX) 0.25 MG tablet    Sig: Take 1 tablet (0.25 mg total) by mouth 3 (three) times daily as needed for anxiety.    Dispense:  30 tablet    Refill:  0    Order Specific Question:   Supervising Provider    Answer:   Gae Dry [[id number]]    I discussed the assessment and treatment plan with the patient. The patient was provided an opportunity to ask questions and all were answered. The patient agreed with the plan and demonstrated an understanding of the instructions.   The patient was advised to call back or seek an in-person evaluation if the symptoms worsen or if the condition fails to improve as anticipated.  I provided 15 minutes of non-face-to-face time during this encounter.   Alicia B. Copland, PA-C 01/13/2019 11:06 AM

## 2019-01-13 ENCOUNTER — Other Ambulatory Visit: Payer: Self-pay

## 2019-01-13 ENCOUNTER — Ambulatory Visit (INDEPENDENT_AMBULATORY_CARE_PROVIDER_SITE_OTHER): Payer: BC Managed Care – PPO | Admitting: Obstetrics and Gynecology

## 2019-01-13 ENCOUNTER — Encounter: Payer: Self-pay | Admitting: Obstetrics and Gynecology

## 2019-01-13 VITALS — Ht 65.0 in | Wt 165.0 lb

## 2019-01-13 DIAGNOSIS — N951 Menopausal and female climacteric states: Secondary | ICD-10-CM

## 2019-01-13 DIAGNOSIS — F419 Anxiety disorder, unspecified: Secondary | ICD-10-CM

## 2019-01-13 MED ORDER — ALPRAZOLAM 0.25 MG PO TABS: 0.2500 mg | ORAL_TABLET | Freq: Three times a day (TID) | ORAL | 0 refills | Status: DC | PRN

## 2019-01-13 MED ORDER — ESCITALOPRAM OXALATE 5 MG PO TABS: 5.0000 mg | ORAL_TABLET | Freq: Every day | ORAL | 0 refills | Status: DC

## 2019-01-13 NOTE — Patient Instructions (Signed)
I value your feedback and entrusting us with your care. If you get a [facility] patient survey, I would appreciate you taking the time to let us know about your experience today. Thank you!  As of January 02, 2019, your lab results will be released to your MyChart immediately, before I even have a chance to see them. Please give me time to review them and contact you if there are any abnormalities. Thank you for your patience.  

## 2019-01-14 ENCOUNTER — Ambulatory Visit: Payer: BC Managed Care – PPO | Attending: Internal Medicine

## 2019-01-14 DIAGNOSIS — Z20822 Contact with and (suspected) exposure to covid-19: Secondary | ICD-10-CM

## 2019-01-16 LAB — NOVEL CORONAVIRUS, NAA: SARS-CoV-2, NAA: NOT DETECTED

## 2019-01-20 ENCOUNTER — Telehealth: Payer: Self-pay

## 2019-01-20 NOTE — Telephone Encounter (Signed)
Pt calling stating for the last 3 nights she has been waking up at 3am and has been having trouble going back to sleep. She is wondering if she is on to high of a dose because last time she was on lexapro she was only on 5mg . Pt would like to speak with [facility] about if this is normal or if it is something else. Thank you

## 2019-01-21 NOTE — Telephone Encounter (Signed)
It could be related to the lexapro. She can either go back to the 5 mg dose now or change the time of day she takes the lexapro (I.e,  If takes in AM, change to PM or vice versa) to see if that is better for her sleep. Can also be an adjustment issue for the first month.

## 2019-03-16 NOTE — Progress Notes (Signed)
Gynecology Annual Exam  PCP: Patient, No Pcp Per  Chief Complaint:  Chief Complaint  Patient presents with  . Gynecologic Exam    menopause messed up her anxiety    History of Present Illness: Patient is a 59 y.o. [id number]  WF who presents for her annual exam. The patient has no gyn complaints today. At her last annual, her hot flashes as well as her anxiety were treated with a 72mm Lexapro and rarely Xanax. She weaned off these medications and was doing well until December when her vasomotor symptoms and anxiety worsened. She was seen by AElmo PuttCopland and started back on these medications. She reports feeling much better since then.   Her LMP was about 2 years ago.The patient is sexually active and is not needing contraceptive since she is menopausal.  Since her last annual exam was 03/13/2018, she has had no other significant changes in her health.  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 Her past medical history is also significant for melanoma and basal cell carcinoma.  Her  last pap was 03/13/2018 and was NIL,. and last mammogram: 08/08/2018 was negative  She had a colonoscopy 08/05/2013 that was normal and her next one is due 2025.  The patient has regular exercise: has just recently started back doing yoga..  There is a family history of breast cancer in her mother at age 59 Genetic testing has not been done. She does do self breast exams monthly. She does not smoke. She drinks socially. She does not use illicit drugs.She does get adequate calcium in her diet. Last cholesterol panel was drawn 2020 and total cholesterol and LDL were borderline.    Review of Systems: Review of Systems  Constitutional: Negative for chills, fever and weight loss.  HENT: Negative for congestion, sinus pain and sore throat.   Eyes: Negative for blurred vision and pain.  Respiratory: Negative for hemoptysis, shortness of breath and wheezing.   Cardiovascular: Negative  for chest pain, palpitations and leg swelling.  Gastrointestinal: Negative for abdominal pain, blood in stool, diarrhea, heartburn, nausea and vomiting.  Genitourinary: Negative for dysuria, frequency, hematuria and urgency.  Musculoskeletal: Negative for back pain, joint pain and myalgias.  Skin: Negative for itching and rash.  Neurological: Negative for dizziness, tingling and headaches.  Endo/Heme/Allergies: Negative for environmental allergies and polydipsia. Does not bruise/bleed easily.       Positive for hot flashes   Psychiatric/Behavioral: Negative for depression. The patient is nervous/anxious. The patient does not have insomnia.      Past Medical History:  Past Medical History:  Diagnosis Date  . Abnormal Pap smear of cervix    +HPV  . Anxiety   . Basal cell carcinoma    Chest  . Family history of breast cancer   . [place]anoma ([place]    Right arm  . 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 COLONOSCOPY  2006/2015  . COLPOSCOPY  2001/2007  . ESOPHAGOGASTRODUODENOSCOPY ENDOSCOPY     for abd pain and N/V  . MOHS SURGERY  11/2017   scalp-squamous cell cancer  . SKIN CANCER EXCISION  2012,2014,2019   Right arm, Chest and top of head   . WISDOM TOOTH EXTRACTION     top two    Gynecologic History:  History of abnormal Pap smears in 2001 and 2007. Colposcopies  revealed HPV changes.  Obstetric History: [id number]  Family History:  Family History  Problem Relation Age of Onset  . Breast cancer Mother 13       met to liver/pancreas  . Diabetes Father   . Heart failure Father   . Skin cancer Father   . Stroke Maternal Uncle   . Alzheimer's disease Maternal Grandmother     Social History:  Social History   Socioeconomic History  . Marital status: Married    Spouse name: Elta Guadeloupe  . Number of children: 2  . Years of education: Not on file  . Highest education level: Not on  file  Occupational History  . Occupation: Radiation protection practitioner  Tobacco Use  . Smoking status: Never Smoker  . Smokeless tobacco: Never Used  Substance and Sexual Activity  . Alcohol use: Yes  . Drug use: No  . Sexual activity: Yes    Birth control/protection: Post-menopausal  Other Topics Concern  . Not on file  Social History Narrative  . Not on file   Social Determinants of Health   Financial Resource Strain:   . Difficulty of Paying Living Expenses: Not on file  Food Insecurity:   . Worried About Charity fundraiser in the Last Year: Not on file  . Ran Out of Food in the Last Year: Not on file  Transportation Needs:   . Lack of Transportation (Medical): Not on file  . Lack of Transportation (Non-Medical): Not on file  Physical Activity:   . Days of Exercise per Week: Not on file  . Minutes of Exercise per Session: Not on file  Stress:   . Feeling of Stress : Not on file  Social Connections:   . Frequency of Communication with Friends and Family: Not on file  . Frequency of Social Gatherings with Friends and Family: Not on file  . Attends Religious Services: Not on file  . Active Member of Clubs or Organizations: Not on file  . Attends Archivist Meetings: Not on file  . Marital Status: Not on file  Intimate Partner Violence:   . Fear of Current or Ex-Partner: Not on file  . Emotionally Abused: Not on file  . Physically Abused: Not on file  . Sexually Abused: Not on file    Allergies:  No Known Allergies  Medications: Prior to Admission medications   Medication Sig Start Date End Date Taking? Authorizing Provider  escitalopram (LEXAPRO) 5 MG tablet Take 5 mg by mouth daily.   Yes Historical Provider, MD  ALPRAZolam (XANAX) 0.25 MG tablet Take 0.25 mg by mouth 3 (three) times daily as needed for anxiety.    Historical Provider, MD    Physical Exam Vitals: BP 110/70   Pulse 72   Ht 5' 5"  (1.651 m)   Wt 181 lb (82.1 kg)   LMP 11/24/2015 (Approximate)   BMI  30.12 kg/m  General: WF in  NAD HEENT: normocephalic, anicteric Thyroid: no enlargement, no palpable nodules Pulmonary: No increased work of breathing, CTAB Cardiovascular: RRR without murmur  Breast: Breast symmetrical, no tenderness, no palpable nodules or masses, no skin or nipple retraction present, no nipple discharge.  No axillary or infraclavicular, or supraclavicular lymphadenopathy. Abdomen:, soft, non-tender, non-distended.  Umbilicus without lesions.  No hepatomegaly,  or masses palpable. No evidence of hernia  Genitourinary:  External: Normal external female genitalia.  Normal urethral meatus, normal  Bartholin's and Skene's glands.    Vagina: Normal vaginal mucosa, no evidence of prolapse.    Cervix:  Grossly normal in appearance, no bleeding, stenotic  Uterus: AV, NSSC, mobile, normal contour, NT  Adnexa: ovaries non-enlarged, no adnexal masses  Rectal: deferred  Lymphatic: no evidence of inguinal lymphadenopathy Extremities: no edema, erythema, or tenderness Neurologic: Grossly intact Psychiatric: mood appropriate, affect full  GAD 7 score today 3, was 16 in December 2020 PHQ 9 score today 1, was 11 in December 2020   Assessment and Plan 1. Encounter for gynecological examination (general) (routine) without abnormal findings   2. Menopausal vasomotor symptoms Continue Lexapro for hot flashes and anxiety  3. Anxiety-has improved significantly with restarting Lexapro Continue Lexapro  4. Routine screening for health maintanence Up to date  5. Screening for cervical cancer  - Pap done thru [facility]  6. Screening for colon cancer: up to date on colonoscopy  7. Screening for breast cancer- Continue annual mammograms,with next due after 08/08/2019  8) Osteoporosis prevention: Discussed calcium and vitamin D3 requirements and the role of exercise in preventing osteoporosis.  9) RTO in 1 year for annual.   Dalia Heading, CNM

## 2019-03-17 ENCOUNTER — Encounter: Payer: Self-pay | Admitting: Certified Nurse Midwife

## 2019-03-17 ENCOUNTER — Other Ambulatory Visit: Payer: Self-pay

## 2019-03-17 ENCOUNTER — Ambulatory Visit (INDEPENDENT_AMBULATORY_CARE_PROVIDER_SITE_OTHER): Payer: BC Managed Care – PPO | Admitting: Certified Nurse Midwife

## 2019-03-17 VITALS — BP 110/70 | HR 72 | Ht 65.0 in | Wt 181.0 lb

## 2019-03-17 DIAGNOSIS — Z01419 Encounter for gynecological examination (general) (routine) without abnormal findings: Secondary | ICD-10-CM | POA: Diagnosis not present

## 2019-03-17 DIAGNOSIS — N951 Menopausal and female climacteric states: Secondary | ICD-10-CM

## 2019-03-17 DIAGNOSIS — F419 Anxiety disorder, unspecified: Secondary | ICD-10-CM | POA: Diagnosis not present

## 2019-03-17 DIAGNOSIS — Z1239 Encounter for other screening for malignant neoplasm of breast: Secondary | ICD-10-CM

## 2019-03-17 DIAGNOSIS — Z8742 Personal history of other diseases of the female genital tract: Secondary | ICD-10-CM

## 2019-03-17 DIAGNOSIS — Z124 Encounter for screening for malignant neoplasm of cervix: Secondary | ICD-10-CM

## 2019-03-17 MED ORDER — ALPRAZOLAM 0.25 MG PO TABS: 0.2500 mg | ORAL_TABLET | Freq: Three times a day (TID) | ORAL | 0 refills | Status: DC | PRN

## 2019-03-17 MED ORDER — ESCITALOPRAM OXALATE 5 MG PO TABS: 5.0000 mg | ORAL_TABLET | Freq: Every day | ORAL | 3 refills | Status: DC

## 2019-04-07 ENCOUNTER — Other Ambulatory Visit: Payer: Self-pay | Admitting: Obstetrics and Gynecology

## 2019-04-07 DIAGNOSIS — N951 Menopausal and female climacteric states: Secondary | ICD-10-CM

## 2019-04-07 DIAGNOSIS — F419 Anxiety disorder, unspecified: Secondary | ICD-10-CM

## 2019-04-07 NOTE — Telephone Encounter (Signed)
Please advise 

## 2019-04-20 NOTE — Progress Notes (Signed)
  Patient, No Pcp Per   Chief Complaint  Patient presents with  . Breast exam    hard lump left breast, no pain/tenderness    HPI:      [name] is a 58 y.o. G2P2002 who LMP was Patient's last menstrual period was 11/24/2015 (approximate)., presents today for LT breast mass, noticed last wk. Less hard and smaller the past 2 days. Mass is in BCC exc scar from about 10 yrs ago. No pain, erythema, trauma, nipple d/c. No prior breast exc in past.  Neg mammo 6/20; FH breast cancer in her mom, genetic testing not indicated. Last annual 2/21  Past Medical History:  Diagnosis Date  . Abnormal Pap smear of cervix    +HPV  . Anxiety   . Basal cell carcinoma    Chest  . Family history of breast cancer   . Melanoma ([facility])    Right arm  . 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 COLONOSCOPY  2006/2015  . COLPOSCOPY  2001/2007  . ESOPHAGOGASTRODUODENOSCOPY ENDOSCOPY     for abd pain and N/V  . MOHS SURGERY  11/2017   scalp-squamous cell cancer  . SKIN CANCER EXCISION  2012,2014,2019   Right arm, Chest and top of head   . WISDOM TOOTH EXTRACTION     top two    Family History  Problem Relation Age of Onset  . Breast cancer Mother 75       met to liver/pancreas  . Diabetes Father   . Heart failure Father   . Skin cancer Father   . Stroke Maternal Uncle   . Alzheimer's disease Maternal Grandmother     Social History   Socioeconomic History  . Marital status: Married    Spouse name: Mark  . Number of children: 2  . Years of education: Not on file  . Highest education level: Not on file  Occupational History  . Occupation: Bookkeeper  Tobacco Use  . Smoking status: Never Smoker  . Smokeless tobacco: Never Used  Substance and Sexual Activity  . Alcohol use: Yes  . Drug use: No  . Sexual activity: Yes    Birth control/protection: Post-menopausal  Other Topics Concern  . Not on file   Social History Narrative  . Not on file   Social Determinants of Health   Financial Resource Strain:   . Difficulty of Paying Living Expenses:   Food Insecurity:   . Worried About Running Out of Food in the Last Year:   . Ran Out of Food in the Last Year:   Transportation Needs:   . Lack of Transportation (Medical):   . Lack of Transportation (Non-Medical):   Physical Activity:   . Days of Exercise per Week:   . Minutes of Exercise per Session:   Stress:   . Feeling of Stress :   Social Connections:   . Frequency of Communication with Friends and Family:   . Frequency of Social Gatherings with Friends and Family:   . Attends Religious Services:   . Active Member of Clubs or Organizations:   . Attends Club or Organization Meetings:   . Marital Status:   Intimate Partner Violence:   . Fear of Current or Ex-Partner:   . Emotionally Abused:   . Physically Abused:   . Sexually Abused:     Outpatient Medications Prior to Visit  Medication Sig Dispense   Refill  . ALPRAZolam (XANAX) 0.25 MG tablet Take 1 tablet (0.25 mg total) by mouth 3 (three) times daily as needed for anxiety. 30 tablet 0  . escitalopram (LEXAPRO) 5 MG tablet Take 1 tablet (5 mg total) by mouth daily. 90 tablet 2   No facility-administered medications prior to visit.      ROS:  Review of Systems  Constitutional: Negative for fatigue, fever and unexpected weight change.  Respiratory: Negative for cough, shortness of breath and wheezing.   Cardiovascular: Negative for chest pain, palpitations and leg swelling.  Gastrointestinal: Negative for blood in stool, constipation, diarrhea, nausea and vomiting.  Endocrine: Negative for cold intolerance, heat intolerance and polyuria.  Genitourinary: Negative for dyspareunia, dysuria, flank pain, frequency, genital sores, hematuria, menstrual problem, pelvic pain, urgency, vaginal bleeding, vaginal discharge and vaginal pain.  Musculoskeletal: Negative for back pain,  joint swelling and myalgias.  Skin: Negative for rash.  Neurological: Negative for dizziness, syncope, light-headedness, numbness and headaches.  Hematological: Negative for adenopathy.  Psychiatric/Behavioral: Negative for agitation, confusion, sleep disturbance and suicidal ideas. The patient is not nervous/anxious.    BREAST: lumps   OBJECTIVE:   Vitals:  BP 110/80   Ht 5' 5" (1.651 m)   Wt 177 lb (80.3 kg)   LMP 11/24/2015 (Approximate)   BMI 29.45 kg/m   Physical Exam Vitals reviewed.  Pulmonary:     Effort: Pulmonary effort is normal.  Chest:     Breasts: Breasts are symmetrical.        Right: No inverted nipple, mass, nipple discharge, skin change or tenderness.        Left: Mass present. No inverted nipple, nipple discharge, skin change or tenderness.    Musculoskeletal:        General: Normal range of motion.     Cervical back: Normal range of motion.  Skin:    General: Skin is warm and dry.  Neurological:     General: No focal deficit present.     Mental Status: She is alert and oriented to person, place, and time.     Cranial Nerves: No cranial nerve deficit.  Psychiatric:        Mood and Affect: Mood normal.        Behavior: Behavior normal.        Thought Content: Thought content normal.        Judgment: Judgment normal.     Assessment/Plan: Left breast mass - Plan: MM DIAG BREAST TOMO UNI LEFT, US BREAST LTD UNI LEFT INC AXILLA; LT breast 10:30 pos. Check dx mammo and u/s. May be cyst vs scar tissue. Will f/u with results.    Return if symptoms worsen or fail to improve.  Ramla Hase B. Wanda Rideout, PA-C 04/21/2019 10:14 AM

## 2019-04-21 ENCOUNTER — Encounter: Payer: Self-pay | Admitting: Obstetrics and Gynecology

## 2019-04-21 ENCOUNTER — Ambulatory Visit (INDEPENDENT_AMBULATORY_CARE_PROVIDER_SITE_OTHER): Payer: BC Managed Care – PPO | Admitting: Obstetrics and Gynecology

## 2019-04-21 ENCOUNTER — Other Ambulatory Visit: Payer: Self-pay

## 2019-04-21 VITALS — BP 110/80 | Ht 65.0 in | Wt 177.0 lb

## 2019-04-21 DIAGNOSIS — N6322 Unspecified lump in the left breast, upper inner quadrant: Secondary | ICD-10-CM | POA: Diagnosis not present

## 2019-04-21 DIAGNOSIS — Z803 Family history of malignant neoplasm of breast: Secondary | ICD-10-CM

## 2019-04-21 DIAGNOSIS — N6320 Unspecified lump in the left breast, unspecified quadrant: Secondary | ICD-10-CM

## 2019-04-21 NOTE — Patient Instructions (Signed)
I value your feedback and entrusting us with your care. If you get a [facility] patient survey, I would appreciate you taking the time to let us know about your experience today. Thank you!  As of January 02, 2019, your lab results will be released to your MyChart immediately, before I even have a chance to see them. Please give me time to review them and contact you if there are any abnormalities. Thank you for your patience.  

## 2019-04-22 ENCOUNTER — Encounter: Payer: Self-pay | Admitting: Certified Nurse Midwife

## 2019-04-29 ENCOUNTER — Ambulatory Visit
Admission: RE | Admit: 2019-04-29 | Discharge: 2019-04-29 | Disposition: A | Discharge status: Home / Self Care | Payer: BC Managed Care – PPO | Source: Ambulatory Visit | Attending: Obstetrics and Gynecology | Admitting: Obstetrics and Gynecology

## 2019-04-29 DIAGNOSIS — N6320 Unspecified lump in the left breast, unspecified quadrant: Secondary | ICD-10-CM

## 2019-05-01 ENCOUNTER — Telehealth: Payer: Self-pay | Admitting: Obstetrics and Gynecology

## 2019-05-01 DIAGNOSIS — Z1231 Encounter for screening mammogram for malignant neoplasm of breast: Secondary | ICD-10-CM

## 2019-05-01 DIAGNOSIS — N6320 Unspecified lump in the left breast, unspecified quadrant: Secondary | ICD-10-CM

## 2019-05-01 NOTE — Telephone Encounter (Signed)
Pt aware of cat 3 breast u/s LT breast. Due for f/u LT breast u/s in 3 months at time of yearly mammo. Order placed for screening mammogram since not differentiated in recent u/s note. Kellee to sched mammo and u/s for pt, due 7/21.

## 2019-05-02 NOTE — Telephone Encounter (Signed)
New orders placed. Thx.

## 2019-05-02 NOTE — Telephone Encounter (Signed)
Orders placed. Thx

## 2019-08-11 ENCOUNTER — Ambulatory Visit
Admission: RE | Admit: 2019-08-11 | Discharge: 2019-08-11 | Disposition: A | Discharge status: Home / Self Care | Payer: BC Managed Care – PPO | Source: Ambulatory Visit | Attending: Obstetrics and Gynecology | Admitting: Obstetrics and Gynecology

## 2019-08-11 ENCOUNTER — Other Ambulatory Visit: Payer: Self-pay | Admitting: Obstetrics and Gynecology

## 2019-08-11 DIAGNOSIS — N6320 Unspecified lump in the left breast, unspecified quadrant: Secondary | ICD-10-CM | POA: Diagnosis present

## 2019-08-11 DIAGNOSIS — R928 Other abnormal and inconclusive findings on diagnostic imaging of breast: Secondary | ICD-10-CM

## 2019-08-11 DIAGNOSIS — Z1231 Encounter for screening mammogram for malignant neoplasm of breast: Secondary | ICD-10-CM | POA: Diagnosis not present

## 2019-08-15 ENCOUNTER — Ambulatory Visit
Admission: RE | Admit: 2019-08-15 | Discharge: 2019-08-15 | Disposition: A | Discharge status: Home / Self Care | Payer: BC Managed Care – PPO | Source: Ambulatory Visit | Attending: Obstetrics and Gynecology | Admitting: Obstetrics and Gynecology

## 2019-08-15 DIAGNOSIS — R928 Other abnormal and inconclusive findings on diagnostic imaging of breast: Secondary | ICD-10-CM

## 2019-08-15 DIAGNOSIS — N6320 Unspecified lump in the left breast, unspecified quadrant: Secondary | ICD-10-CM | POA: Diagnosis present

## 2019-08-18 LAB — SURGICAL PATHOLOGY

## 2020-07-27 ENCOUNTER — Other Ambulatory Visit (HOSPITAL_COMMUNITY)
Admission: RE | Admit: 2020-07-27 | Discharge: 2020-07-27 | Disposition: A | Discharge status: Home / Self Care | Payer: BC Managed Care – PPO | Source: Ambulatory Visit | Attending: Obstetrics and Gynecology | Admitting: Obstetrics and Gynecology

## 2020-07-27 ENCOUNTER — Other Ambulatory Visit: Payer: Self-pay

## 2020-07-27 ENCOUNTER — Encounter: Payer: Self-pay | Admitting: Obstetrics and Gynecology

## 2020-07-27 ENCOUNTER — Ambulatory Visit (INDEPENDENT_AMBULATORY_CARE_PROVIDER_SITE_OTHER): Payer: BC Managed Care – PPO | Admitting: Obstetrics and Gynecology

## 2020-07-27 VITALS — BP 100/60 | Ht 65.0 in | Wt 136.0 lb

## 2020-07-27 DIAGNOSIS — Z1151 Encounter for screening for human papillomavirus (HPV): Secondary | ICD-10-CM | POA: Insufficient documentation

## 2020-07-27 DIAGNOSIS — R87610 Atypical squamous cells of undetermined significance on cytologic smear of cervix (ASC-US): Secondary | ICD-10-CM | POA: Insufficient documentation

## 2020-07-27 DIAGNOSIS — Z1231 Encounter for screening mammogram for malignant neoplasm of breast: Secondary | ICD-10-CM

## 2020-07-27 DIAGNOSIS — Z124 Encounter for screening for malignant neoplasm of cervix: Secondary | ICD-10-CM | POA: Insufficient documentation

## 2020-07-27 DIAGNOSIS — Z01419 Encounter for gynecological examination (general) (routine) without abnormal findings: Secondary | ICD-10-CM | POA: Diagnosis not present

## 2020-07-27 DIAGNOSIS — F419 Anxiety disorder, unspecified: Secondary | ICD-10-CM

## 2020-07-27 DIAGNOSIS — N951 Menopausal and female climacteric states: Secondary | ICD-10-CM

## 2020-07-27 MED ORDER — ALPRAZOLAM 0.25 MG PO TABS: 0.2500 mg | ORAL_TABLET | Freq: Three times a day (TID) | ORAL | 0 refills | Status: DC | PRN

## 2020-07-27 MED ORDER — ESCITALOPRAM OXALATE 5 MG PO TABS: 5.0000 mg | ORAL_TABLET | Freq: Every day | ORAL | 3 refills | Status: DC

## 2020-07-27 NOTE — Patient Instructions (Signed)
I value your feedback and you entrusting us with your care. If you get a [facility] patient survey, I would appreciate you taking the time to let us know about your experience today. Thank you!  [facility]: [phone]      

## 2020-07-27 NOTE — Progress Notes (Signed)
PCP: Chad Cordial, PA-C   Chief Complaint  Patient presents with   Gynecologic Exam    No concerns    HPI:      [name] is a 60 y.o. [id number] whose LMP was Patient's last menstrual period was 11/24/2015 (approximate)., presents today for her annual examination.  Her menses are absent with menopause. No PMB, occas vasomotor sx. Takes lexapro for menopausal sx with sx control. Takes xanax rarely for occas anxiety with flying, etc. Needs RF.   Sex activity: single partner, contraception - post menopausal status. She does not have vaginal dryness.  Last Pap: 03/17/19 Results were: ASCUS with NEGATIVE high risk HPV /neg HPV DNA. Repeat due today. Hx of STDs: none  Last mammogram: 08/11/19  Results were: cat 4 LT breast mass in [facility] excision scar, with neg bx; repeat scr mammo due 7/22.  There is a FH of breast cancer in her mom, genetic testing not indicated. There is no FH of ovarian cancer. The patient does do self-breast exams.  Colonoscopy: 2015 without abnormalities; Repeat due after 10 years.   Tobacco use: The patient denies current or previous tobacco use. Alcohol use: social drinker No drug use Exercise: moderately active  She does get adequate calcium but not Vitamin D in her diet.  Labs done 2/20.  Past Medical History:  Diagnosis Date   Abnormal Pap smear of cervix    +HPV   Anxiety    Basal cell carcinoma    Chest   Family history of breast cancer    Melanoma ([place])    Right arm   Squamous cell cancer of skin of crown    Back middle    Past Surgical History:  Procedure Laterality Date   COLONOSCOPY  2006/2015   COLPOSCOPY  2001/2007   ESOPHAGOGASTRODUODENOSCOPY ENDOSCOPY     for abd pain and N/V   MOHS SURGERY  11/2017   scalp-squamous cell cancer   SKIN CANCER EXCISION  2012,2014,2019   Right arm, Chest and top of head    WISDOM TOOTH EXTRACTION     top two    Family History  Problem Relation Age of Onset   Breast cancer Mother 86        met to liver/pancreas   Diabetes Father    Heart failure Father    Skin cancer Father    Stroke Maternal Uncle    Alzheimer's disease Maternal Grandmother     Social History   Socioeconomic History   Marital status: Married    Spouse name: Doctor, general practice   Number of children: 2   Years of education: Not on file   Highest education level: Not on file  Occupational History   Occupation: Radiation protection practitioner  Tobacco Use   Smoking status: Never   Smokeless tobacco: Never  Vaping Use   Vaping Use: Never used  Substance and Sexual Activity   Alcohol use: Yes   Drug use: No   Sexual activity: Yes    Birth control/protection: Post-menopausal  Other Topics Concern   Not on file  Social History Narrative   Not on file   Social Determinants of Health   Financial Resource Strain: Not on file  Food Insecurity: Not on file  Transportation Needs: Not on file  Physical Activity: Not on file  Stress: Not on file  Social Connections: Not on file  Intimate Partner Violence: Not on file     Current Outpatient Medications:    ALPRAZolam (XANAX) 0.25 MG tablet, Take  1 tablet (0.25 mg total) by mouth 3 (three) times daily as needed for anxiety., Disp: 30 tablet, Rfl: 0   escitalopram (LEXAPRO) 5 MG tablet, Take 1 tablet (5 mg total) by mouth daily., Disp: 90 tablet, Rfl: 3     ROS:  Review of Systems  Constitutional:  Negative for fatigue, fever and unexpected weight change.  Respiratory:  Negative for cough, shortness of breath and wheezing.   Cardiovascular:  Negative for chest pain, palpitations and leg swelling.  Gastrointestinal:  Negative for blood in stool, constipation, diarrhea, nausea and vomiting.  Endocrine: Negative for cold intolerance, heat intolerance and polyuria.  Genitourinary:  Negative for dyspareunia, dysuria, flank pain, frequency, genital sores, hematuria, menstrual problem, pelvic pain, urgency, vaginal bleeding, vaginal discharge and vaginal pain.  Musculoskeletal:   Negative for back pain, joint swelling and myalgias.  Skin:  Negative for rash.  Neurological:  Negative for dizziness, syncope, light-headedness, numbness and headaches.  Hematological:  Negative for adenopathy.  Psychiatric/Behavioral:  Positive for agitation. Negative for confusion, dysphoric mood, sleep disturbance and suicidal ideas. The patient is not nervous/anxious.   BREAST: No symptoms    Objective: BP 100/60   Ht 5' 5" (1.651 m)   Wt 136 lb (61.7 kg)   LMP 11/24/2015 (Approximate)   BMI 22.63 kg/m    Physical Exam Constitutional:      Appearance: She is well-developed.  Genitourinary:     Vulva normal.     Right Labia: No rash, tenderness or lesions.    Left Labia: No tenderness, lesions or rash.    No vaginal discharge, erythema or tenderness.      Right Adnexa: not tender and no mass present.    Left Adnexa: not tender and no mass present.    No cervical friability or polyp.     Uterus is not enlarged or tender.  Breasts:    Right: No mass, nipple discharge, skin change or tenderness.     Left: No mass, nipple discharge, skin change or tenderness.  Neck:     Thyroid: No thyromegaly.  Cardiovascular:     Rate and Rhythm: Normal rate and regular rhythm.     Heart sounds: Normal heart sounds. No murmur heard. Pulmonary:     Effort: Pulmonary effort is normal.     Breath sounds: Normal breath sounds.  Abdominal:     Palpations: Abdomen is soft.     Tenderness: There is no abdominal tenderness. There is no guarding or rebound.  Musculoskeletal:        General: Normal range of motion.     Cervical back: Normal range of motion.  Lymphadenopathy:     Cervical: No cervical adenopathy.  Neurological:     General: No focal deficit present.     Mental Status: She is alert and oriented to person, place, and time.     Cranial Nerves: No cranial nerve deficit.  Skin:    General: Skin is warm and dry.  Psychiatric:        Mood and Affect: Mood normal.         Behavior: Behavior normal.        Thought Content: Thought content normal.        Judgment: Judgment normal.  Vitals reviewed.    Assessment/Plan:  Encounter for annual routine gynecological examination  Cervical cancer screening - Plan: Cytology - PAP  Screening for HPV (human papillomavirus) - Plan: Cytology - PAP  ASCUS of cervix with negative high risk HPV - Plan: Cytology -  PAP; repeat pap today  Encounter for screening mammogram for malignant neoplasm of breast - Plan: MM 3D SCREEN BREAST BILATERAL; pt to sched mammo  Anxiety - Plan: escitalopram (LEXAPRO) 5 MG tablet, ALPRAZolam (XANAX) 0.25 MG tablet; Rx RF. F/u prn.   Perimenopausal vasomotor symptoms - Plan: escitalopram (LEXAPRO) 5 MG tablet; Rx RF. F/u prn.    Meds ordered this encounter  Medications   escitalopram (LEXAPRO) 5 MG tablet    Sig: Take 1 tablet (5 mg total) by mouth daily.    Dispense:  90 tablet    Refill:  3    Order Specific Question:   Supervising Provider    Answer:   Gae Dry [[id number]]   ALPRAZolam (XANAX) 0.25 MG tablet    Sig: Take 1 tablet (0.25 mg total) by mouth 3 (three) times daily as needed for anxiety.    Dispense:  30 tablet    Refill:  0    Order Specific Question:   Supervising Provider    Answer:   Gae Dry [[id number]]             GYN counsel breast self exam, mammography screening, menopause, adequate intake of calcium and vitamin D, diet and exercise    F/U  Return in about 1 year (around 07/27/2021).  Alicia B. Copland, PA-C 07/27/2020 11:09 AM

## 2020-08-02 LAB — CYTOLOGY - PAP
Comment: NEGATIVE
Diagnosis: NEGATIVE
High risk HPV: NEGATIVE

## 2020-08-12 ENCOUNTER — Other Ambulatory Visit: Payer: Self-pay

## 2020-08-12 ENCOUNTER — Ambulatory Visit
Admission: RE | Admit: 2020-08-12 | Discharge: 2020-08-12 | Disposition: A | Discharge status: Home / Self Care | Payer: BC Managed Care – PPO | Source: Ambulatory Visit | Attending: Obstetrics and Gynecology | Admitting: Obstetrics and Gynecology

## 2020-08-12 DIAGNOSIS — Z1231 Encounter for screening mammogram for malignant neoplasm of breast: Secondary | ICD-10-CM | POA: Insufficient documentation

## 2020-11-18 ENCOUNTER — Telehealth: Payer: Self-pay | Admitting: Obstetrics and Gynecology

## 2020-11-18 DIAGNOSIS — R899 Unspecified abnormal finding in specimens from other organs, systems and tissues: Secondary | ICD-10-CM

## 2020-11-18 NOTE — Telephone Encounter (Signed)
Called pt, no answer, LVMTRC. 

## 2020-11-18 NOTE — Telephone Encounter (Signed)
Ask pt to scan form into MyChart so I know what she needs or give the info to you. I can then order. Not sure what "ALP" is. Thinking ALT?

## 2020-11-18 NOTE — Telephone Encounter (Signed)
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  The labs need to include ALP.  Can you order this for her?

## 2020-11-18 NOTE — Telephone Encounter (Signed)
Lab order placed. Psl notify pt to sched lab appt. Thx.

## 2020-11-22 ENCOUNTER — Other Ambulatory Visit: Payer: Self-pay | Admitting: Obstetrics and Gynecology

## 2020-11-22 DIAGNOSIS — R899 Unspecified abnormal finding in specimens from other organs, systems and tissues: Secondary | ICD-10-CM

## 2020-11-22 NOTE — Progress Notes (Signed)
Lab orders for CMP. Pt needs full panel instead of alk phos only.

## 2020-11-29 ENCOUNTER — Other Ambulatory Visit: Payer: Self-pay

## 2020-11-29 ENCOUNTER — Other Ambulatory Visit: Payer: BC Managed Care – PPO

## 2020-11-29 DIAGNOSIS — R899 Unspecified abnormal finding in specimens from other organs, systems and tissues: Secondary | ICD-10-CM

## 2020-11-30 LAB — COMPREHENSIVE METABOLIC PANEL
ALT: 16 IU/L (ref 0–32)
AST: 14 IU/L (ref 0–40)
Albumin/Globulin Ratio: 2.4 — ABNORMAL HIGH (ref 1.2–2.2)
Albumin: 4.6 g/dL (ref 3.8–4.9)
Alkaline Phosphatase: 92 IU/L (ref 44–121)
BUN/Creatinine Ratio: 20 (ref 9–23)
BUN: 18 mg/dL (ref 6–24)
Bilirubin Total: 0.6 mg/dL (ref 0.0–1.2)
CO2: 26 mmol/L (ref 20–29)
Calcium: 9.5 mg/dL (ref 8.7–10.2)
Chloride: 106 mmol/L (ref 96–106)
Creatinine, Ser: 0.9 mg/dL (ref 0.57–1.00)
Globulin, Total: 1.9 g/dL (ref 1.5–4.5)
Glucose: 77 mg/dL (ref 70–99)
Potassium: 4.5 mmol/L (ref 3.5–5.2)
Sodium: 143 mmol/L (ref 134–144)
Total Protein: 6.5 g/dL (ref 6.0–8.5)
eGFR: 74 mL/min/{1.73_m2} (ref >59)

## 2020-12-31 ENCOUNTER — Telehealth: Payer: Self-pay

## 2020-12-31 NOTE — Telephone Encounter (Signed)
Pt calling; is currently taking 5mg  lexapro; her anxiety has been up this week; wondering if she can go back to 10mg ?  [phone]  Pt identified herself on msg greeting; left detailed msg ABC not in office today but hopes to be able to check her messages late this afternoon, tomorrow for sure.

## 2021-01-04 ENCOUNTER — Other Ambulatory Visit: Payer: Self-pay | Admitting: Obstetrics and Gynecology

## 2021-01-04 DIAGNOSIS — N951 Menopausal and female climacteric states: Secondary | ICD-10-CM

## 2021-01-04 DIAGNOSIS — F419 Anxiety disorder, unspecified: Secondary | ICD-10-CM

## 2021-01-04 MED ORDER — ESCITALOPRAM OXALATE 10 MG PO TABS: 10.0000 mg | ORAL_TABLET | Freq: Every day | ORAL | 2 refills | Status: DC

## 2021-01-04 NOTE — Telephone Encounter (Signed)
Yes, pt can go up to 10 mg. Can take 2 tabs daily for now. If she wants to stay on that dose, will send in new Rx. If it's just temporary with holidays, etc, she can then back down to 5 mg dose. Keep me posted. Thx

## 2021-01-04 NOTE — Telephone Encounter (Signed)
Pt aware.  Pt requests new rx for 10mg  90d no refills to be sent to [facility] in [place].  Thanks.

## 2021-01-04 NOTE — Telephone Encounter (Signed)
Pt calling; hasn't heard back from [facility]; her anxiety has kicked up; wants to know if she can increase dose to 10mg  instead of 5mg  which she has done the last few days; is going out of the country in Jan so needs enough medication to get her through the trip.  [phone]

## 2021-01-04 NOTE — Telephone Encounter (Signed)
Rx eRxd.  

## 2021-01-04 NOTE — Progress Notes (Signed)
Rx dose increase to lexapro 10 mg due to increased anxiety

## 2021-09-27 ENCOUNTER — Other Ambulatory Visit: Payer: Self-pay | Admitting: Obstetrics and Gynecology

## 2021-09-27 DIAGNOSIS — N951 Menopausal and female climacteric states: Secondary | ICD-10-CM

## 2021-09-27 DIAGNOSIS — F419 Anxiety disorder, unspecified: Secondary | ICD-10-CM

## 2021-10-06 ENCOUNTER — Other Ambulatory Visit: Payer: Self-pay | Admitting: Obstetrics and Gynecology

## 2021-10-06 DIAGNOSIS — Z1231 Encounter for screening mammogram for malignant neoplasm of breast: Secondary | ICD-10-CM

## 2021-11-02 ENCOUNTER — Ambulatory Visit
Admission: RE | Admit: 2021-11-02 | Discharge: 2021-11-02 | Disposition: A | Discharge status: Home / Self Care | Payer: BC Managed Care – PPO | Source: Ambulatory Visit | Attending: Obstetrics and Gynecology | Admitting: Obstetrics and Gynecology

## 2021-11-02 DIAGNOSIS — Z1231 Encounter for screening mammogram for malignant neoplasm of breast: Secondary | ICD-10-CM | POA: Diagnosis present

## 2022-01-04 NOTE — Progress Notes (Unsigned)
PCP: Chad Cordial, PA-C   No chief complaint on file.   HPI:      [name] is a 61 y.o. [id number] whose LMP was Patient's last menstrual period was 11/24/2015 (approximate)., presents today for her annual examination.  Her menses are absent with menopause. No PMB, occas vasomotor sx. Takes lexapro for menopausal sx with sx control. Takes xanax rarely for occas anxiety with flying, etc. Needs RF.   Sex activity: single partner, contraception - post menopausal status. She does not have vaginal dryness.  Last Pap: 07/27/20 Results were normal/neg HPV DNA; 03/17/19 Results were: ASCUS with NEGATIVE high risk HPV  Hx of STDs: none  Last mammogram: 11/02/21 Results were normal, repeat in 12 months; 08/11/19  Results were: cat 4 LT breast mass in [facility] excision scar, with neg bx There is a FH of breast cancer in her mom, genetic testing not indicated. There is no FH of ovarian cancer. The patient does do self-breast exams.  Colonoscopy: 2015 without abnormalities; Repeat due after 10 years.   Tobacco use: The patient denies current or previous tobacco use. Alcohol use: social drinker No drug use Exercise: moderately active  She does get adequate calcium but not Vitamin D in her diet.  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    Past Surgical History:  Procedure Laterality Date   COLONOSCOPY  2006/2015   COLPOSCOPY  2001/2007   ESOPHAGOGASTRODUODENOSCOPY ENDOSCOPY     for abd pain and N/V   MOHS SURGERY  11/2017   scalp-squamous cell cancer   SKIN CANCER EXCISION  2012,2014,2019   Right arm, Chest and top of head    WISDOM TOOTH EXTRACTION     top two    Family History  Problem Relation Age of Onset   Breast cancer Mother 81        met to liver/pancreas   Diabetes Father    Heart failure Father    Skin cancer Father    Stroke Maternal Uncle    Alzheimer's disease Maternal Grandmother     Social History   Socioeconomic History   Marital status: Married    Spouse name: Doctor, general practice   Number of children: 2   Years of education: Not on file   Highest education level: Not on file  Occupational History   Occupation: Radiation protection practitioner  Tobacco Use   Smoking status: Never   Smokeless tobacco: Never  Vaping Use   Vaping Use: Never used  Substance and Sexual Activity   Alcohol use: Yes   Drug use: No   Sexual activity: Yes    Birth control/protection: Post-menopausal  Other Topics Concern   Not on file  Social History Narrative   Not on file   Social Determinants of Health   Financial Resource Strain: Not on file  Food Insecurity: Not on file  Transportation Needs: Not on file  Physical Activity: Not on file  Stress: Not on file  Social Connections: Not on file  Intimate Partner Violence: Not on file     Current Outpatient Medications:    ALPRAZolam (XANAX) 0.25 MG tablet, Take 1 tablet (0.25 mg total)  by mouth 3 (three) times daily as needed for anxiety., Disp: 30 tablet, Rfl: 0   escitalopram (LEXAPRO) 10 MG tablet, Take 1 tablet (10 mg total) by mouth daily., Disp: 90 tablet, Rfl: 2     ROS:  Review of Systems  Constitutional:  Negative for fatigue, fever and unexpected weight change.  Respiratory:  Negative for cough, shortness of breath and wheezing.   Cardiovascular:  Negative for chest pain, palpitations and leg swelling.  Gastrointestinal:  Negative for blood in stool, constipation, diarrhea, nausea and vomiting.  Endocrine: Negative for cold intolerance, heat intolerance and polyuria.  Genitourinary:  Negative for dyspareunia, dysuria, flank pain, frequency, genital sores, hematuria, menstrual problem, pelvic pain, urgency, vaginal bleeding, vaginal discharge and vaginal pain.   Musculoskeletal:  Negative for back pain, joint swelling and myalgias.  Skin:  Negative for rash.  Neurological:  Negative for dizziness, syncope, light-headedness, numbness and headaches.  Hematological:  Negative for adenopathy.  Psychiatric/Behavioral:  Positive for agitation. Negative for confusion, dysphoric mood, sleep disturbance and suicidal ideas. The patient is not nervous/anxious.    BREAST: No symptoms    Objective: LMP 11/24/2015 (Approximate)    Physical Exam Constitutional:      Appearance: She is well-developed.  Genitourinary:     Vulva normal.     Right Labia: No rash, tenderness or lesions.    Left Labia: No tenderness, lesions or rash.    No vaginal discharge, erythema or tenderness.      Right Adnexa: not tender and no mass present.    Left Adnexa: not tender and no mass present.    No cervical friability or polyp.     Uterus is not enlarged or tender.  Breasts:    Right: No mass, nipple discharge, skin change or tenderness.     Left: No mass, nipple discharge, skin change or tenderness.  Neck:     Thyroid: No thyromegaly.  Cardiovascular:     Rate and Rhythm: Normal rate and regular rhythm.     Heart sounds: Normal heart sounds. No murmur heard. Pulmonary:     Effort: Pulmonary effort is normal.     Breath sounds: Normal breath sounds.  Abdominal:     Palpations: Abdomen is soft.     Tenderness: There is no abdominal tenderness. There is no guarding or rebound.  Musculoskeletal:        General: Normal range of motion.     Cervical back: Normal range of motion.  Lymphadenopathy:     Cervical: No cervical adenopathy.  Neurological:     General: No focal deficit present.     Mental Status: She is alert and oriented to person, place, and time.     Cranial Nerves: No cranial nerve deficit.  Skin:    General: Skin is warm and dry.  Psychiatric:        Mood and Affect: Mood normal.        Behavior: Behavior normal.        Thought Content:  Thought content normal.        Judgment: Judgment normal.  Vitals reviewed.     Assessment/Plan:  Encounter for annual routine gynecological examination  Cervical cancer screening - Plan: Cytology - PAP  Screening for HPV (human papillomavirus) - Plan: Cytology - PAP  ASCUS of cervix with negative high risk HPV - Plan: Cytology - PAP; repeat pap today  Encounter for screening mammogram for malignant neoplasm of breast - Plan: MM 3D SCREEN BREAST BILATERAL; pt to sched mammo  Anxiety - Plan: escitalopram (LEXAPRO) 5 MG tablet, ALPRAZolam (XANAX) 0.25 MG tablet; Rx RF. F/u prn.   Perimenopausal vasomotor symptoms - Plan: escitalopram (LEXAPRO) 5 MG tablet; Rx RF. F/u prn.    No orders of the defined types were placed in this encounter.            GYN counsel breast self exam, mammography screening, menopause, adequate intake of calcium and vitamin D, diet and exercise    F/U  No follow-ups on file.  Ki Corbo B. Demarrion Meiklejohn, PA-C 01/04/2022 5:21 PM

## 2022-01-05 ENCOUNTER — Ambulatory Visit (INDEPENDENT_AMBULATORY_CARE_PROVIDER_SITE_OTHER): Payer: BC Managed Care – PPO | Admitting: Obstetrics and Gynecology

## 2022-01-05 ENCOUNTER — Encounter: Payer: Self-pay | Admitting: Obstetrics and Gynecology

## 2022-01-05 VITALS — BP 90/64 | Ht 65.0 in | Wt 153.0 lb

## 2022-01-05 DIAGNOSIS — Z0000 Encounter for general adult medical examination without abnormal findings: Secondary | ICD-10-CM

## 2022-01-05 DIAGNOSIS — N951 Menopausal and female climacteric states: Secondary | ICD-10-CM | POA: Diagnosis not present

## 2022-01-05 DIAGNOSIS — Z01411 Encounter for gynecological examination (general) (routine) with abnormal findings: Secondary | ICD-10-CM

## 2022-01-05 DIAGNOSIS — Z1231 Encounter for screening mammogram for malignant neoplasm of breast: Secondary | ICD-10-CM

## 2022-01-05 DIAGNOSIS — Z01419 Encounter for gynecological examination (general) (routine) without abnormal findings: Secondary | ICD-10-CM

## 2022-01-05 DIAGNOSIS — E785 Hyperlipidemia, unspecified: Secondary | ICD-10-CM

## 2022-01-05 DIAGNOSIS — F419 Anxiety disorder, unspecified: Secondary | ICD-10-CM | POA: Diagnosis not present

## 2022-01-05 MED ORDER — ESCITALOPRAM OXALATE 10 MG PO TABS: 10.0000 mg | ORAL_TABLET | Freq: Every day | ORAL | 3 refills | Status: DC

## 2022-01-05 MED ORDER — ALPRAZOLAM 0.25 MG PO TABS: 0.2500 mg | ORAL_TABLET | Freq: Three times a day (TID) | ORAL | 0 refills | Status: AC | PRN

## 2022-01-05 NOTE — Patient Instructions (Signed)
I value your feedback and you entrusting us with your care. If you get a [facility] patient survey, I would appreciate you taking the time to let us know about your experience today. Thank you! ? ? ?

## 2022-12-20 ENCOUNTER — Other Ambulatory Visit: Payer: Self-pay | Admitting: Obstetrics and Gynecology

## 2022-12-20 DIAGNOSIS — Z1231 Encounter for screening mammogram for malignant neoplasm of breast: Secondary | ICD-10-CM

## 2023-01-30 ENCOUNTER — Ambulatory Visit
Admission: RE | Admit: 2023-01-30 | Discharge: 2023-01-30 | Disposition: A | Discharge status: Home / Self Care | Payer: 59 | Source: Ambulatory Visit | Attending: Obstetrics and Gynecology | Admitting: Obstetrics and Gynecology

## 2023-01-30 DIAGNOSIS — Z1231 Encounter for screening mammogram for malignant neoplasm of breast: Secondary | ICD-10-CM | POA: Diagnosis present

## 2023-01-30 NOTE — Progress Notes (Signed)
 PCP: Watt Bernarda NOVAK, PA-C   Chief Complaint  Patient presents with   Gynecologic Exam    No concerns    HPI:      [name] is a 63 y.o. [id number] whose LMP was Patient's last menstrual period was 11/24/2015 (approximate)., presents today for her annual examination.  Her menses are absent with menopause. No PMB, occas vasomotor sx. Takes lexapro  5 gm (1/2 tab 10 mg) for menopausal sx and anxiety with sx control. May want to wean off because unsure she needs it now. Takes xanax  rarely for occas anxiety with flying, etc, doesn't need RF.  Sex activity: single partner, contraception - post menopausal status. She does not have vaginal dryness. No pain/bleeding.  Last Pap: 07/27/20 Results were normal/neg HPV DNA; 03/17/19 Results were: ASCUS with NEGATIVE high risk HPV  Hx of STDs: none  Last mammogram: 01/30/23 Results were normal, repeat in 12 months; 08/11/19  Results were: cat 4 LT breast mass in [facility] excision scar, with neg bx There is a FH of breast cancer in her mom, genetic testing not indicated. There is no FH of ovarian cancer. The patient does do self-breast exams.  Colonoscopy: 2015 without abnormalities at [facility]; Repeat due after 10 years.   Tobacco use: The patient denies current or previous tobacco use. Alcohol use: none No drug use Exercise: moderately active  She does get adequate calcium and Vitamin D in her diet.  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    Past Surgical History:  Procedure Laterality Date   BREAST BIOPSY Left    2021  benign   COLONOSCOPY  2006/2015   COLPOSCOPY  2001/2007   ESOPHAGOGASTRODUODENOSCOPY ENDOSCOPY     for abd pain and N/V   MOHS SURGERY  11/2017   scalp-squamous  cell cancer   SKIN CANCER EXCISION  2012,2014,2019   Right arm, Chest and top of head    WISDOM TOOTH EXTRACTION     top two    Family History  Problem Relation Age of Onset   Breast cancer Mother 72       met to liver/pancreas   Diabetes Father    Heart failure Father    Skin cancer Father    Stroke Maternal Uncle    Alzheimer's disease Maternal Grandmother     Social History   Socioeconomic History   Marital status: Married    Spouse name: Merchant [employer] Officer   Number of children: 2   Years of education: Not on file   Highest education level: Not on file  Occupational History   Occupation: Catering Manager  Tobacco Use   Smoking status: Never   Smokeless tobacco: Never  Vaping Use   Vaping status: Never Used  Substance and Sexual Activity   Alcohol use: Yes    Comment: soc   Drug use: No   Sexual activity: Yes    Birth control/protection: Post-menopausal  Other Topics Concern   Not on file  Social History Narrative   Not on file   Social Drivers of Health   Financial Resource Strain: Not on file  Food Insecurity: Not on  file  Transportation Needs: Not on file  Physical Activity: Not on file  Stress: Not on file  Social Connections: Not on file  Intimate Partner Violence: Not on file     Current Outpatient Medications:    ALPRAZolam  (XANAX ) 0.25 MG tablet, Take 1 tablet (0.25 mg total) by mouth 3 (three) times daily as needed for anxiety., Disp: 20 tablet, Rfl: 0   escitalopram  (LEXAPRO ) 5 MG tablet, Take 1 tablet (5 mg total) by mouth daily., Disp: 90 tablet, Rfl: 3     ROS:  Review of Systems  Constitutional:  Negative for fatigue, fever and unexpected weight change.  Respiratory:  Negative for cough, shortness of breath and wheezing.   Cardiovascular:  Negative for chest pain, palpitations and leg swelling.  Gastrointestinal:  Negative for blood in stool, constipation, diarrhea, nausea and vomiting.  Endocrine: Negative for cold intolerance, heat intolerance and  polyuria.  Genitourinary:  Negative for dyspareunia, dysuria, flank pain, frequency, genital sores, hematuria, menstrual problem, pelvic pain, urgency, vaginal bleeding, vaginal discharge and vaginal pain.  Musculoskeletal:  Negative for back pain, joint swelling and myalgias.  Skin:  Negative for rash.  Neurological:  Negative for dizziness, syncope, light-headedness, numbness and headaches.  Hematological:  Negative for adenopathy.  Psychiatric/Behavioral:  Negative for agitation, confusion, dysphoric mood, sleep disturbance and suicidal ideas. The patient is not nervous/anxious.    BREAST: No symptoms    Objective: BP 111/67   Pulse 69   Ht 5' 5 (1.651 m)   Wt 162 lb (73.5 kg)   LMP 11/24/2015 (Approximate)   BMI 26.96 kg/m    Physical Exam Constitutional:      Appearance: She is well-developed.  Genitourinary:     Vulva normal.     Right Labia: No rash, tenderness or lesions.    Left Labia: No tenderness, lesions or rash.    No vaginal discharge, erythema or tenderness.      Right Adnexa: not tender and no mass present.    Left Adnexa: not tender and no mass present.    No cervical friability or polyp.     Uterus is not enlarged or tender.  Breasts:    Right: No mass, nipple discharge, skin change or tenderness.     Left: No mass, nipple discharge, skin change or tenderness.  Neck:     Thyroid: No thyromegaly.  Cardiovascular:     Rate and Rhythm: Normal rate and regular rhythm.     Heart sounds: Normal heart sounds. No murmur heard. Pulmonary:     Effort: Pulmonary effort is normal.     Breath sounds: Normal breath sounds.  Abdominal:     Palpations: Abdomen is soft.     Tenderness: There is no abdominal tenderness. There is no guarding or rebound.  Musculoskeletal:        General: Normal range of motion.     Cervical back: Normal range of motion.  Lymphadenopathy:     Cervical: No cervical adenopathy.  Neurological:     General: No focal deficit present.      Mental Status: She is alert and oriented to person, place, and time.     Cranial Nerves: No cranial nerve deficit.  Skin:    General: Skin is warm and dry.  Psychiatric:        Mood and Affect: Mood normal.        Behavior: Behavior normal.        Thought Content: Thought content normal.  Judgment: Judgment normal.  Vitals reviewed.     Assessment/Plan:  Encounter for annual routine gynecological examination  Encounter for screening mammogram for malignant neoplasm of breast; pt current on mammo  Screening for colon cancer - Plan: Ambulatory referral to Gastroenterology; refer to [facility]  Anxiety - Plan: escitalopram  (LEXAPRO ) 5 MG tablet; Rx RF eRxd, discussed how to wean off. F/u prn.   Perimenopausal vasomotor symptoms - Plan: escitalopram  (LEXAPRO ) 5 MG tablet  Blood tests for routine general physical examination - Plan: Comprehensive metabolic panel, CBC with Differential/Platelet, Lipid panel, Hemoglobin A1c  Screening cholesterol level - Plan: Lipid panel  Screening for diabetes mellitus - Plan: Hemoglobin A1c   Meds ordered this encounter  Medications   escitalopram  (LEXAPRO ) 5 MG tablet    Sig: Take 1 tablet (5 mg total) by mouth daily.    Dispense:  90 tablet    Refill:  3    Supervising Provider:   CHERRY, ANIKA [[id number]]            GYN counsel breast self exam, mammography screening, menopause, adequate intake of calcium and vitamin D, diet and exercise    F/U  Return in about 1 year (around 02/01/2024).  Jassen Sarver B. Lianni Kanaan, PA-C 02/01/2023 11:47 AM

## 2023-02-01 ENCOUNTER — Encounter: Payer: Self-pay | Admitting: Obstetrics and Gynecology

## 2023-02-01 ENCOUNTER — Ambulatory Visit (INDEPENDENT_AMBULATORY_CARE_PROVIDER_SITE_OTHER): Payer: 59 | Admitting: Obstetrics and Gynecology

## 2023-02-01 VITALS — BP 111/67 | HR 69 | Ht 65.0 in | Wt 162.0 lb

## 2023-02-01 DIAGNOSIS — F419 Anxiety disorder, unspecified: Secondary | ICD-10-CM

## 2023-02-01 DIAGNOSIS — Z13220 Encounter for screening for lipoid disorders: Secondary | ICD-10-CM

## 2023-02-01 DIAGNOSIS — N951 Menopausal and female climacteric states: Secondary | ICD-10-CM

## 2023-02-01 DIAGNOSIS — Z1231 Encounter for screening mammogram for malignant neoplasm of breast: Secondary | ICD-10-CM

## 2023-02-01 DIAGNOSIS — Z01419 Encounter for gynecological examination (general) (routine) without abnormal findings: Secondary | ICD-10-CM | POA: Diagnosis not present

## 2023-02-01 DIAGNOSIS — Z1211 Encounter for screening for malignant neoplasm of colon: Secondary | ICD-10-CM

## 2023-02-01 DIAGNOSIS — Z0000 Encounter for general adult medical examination without abnormal findings: Secondary | ICD-10-CM

## 2023-02-01 DIAGNOSIS — Z131 Encounter for screening for diabetes mellitus: Secondary | ICD-10-CM

## 2023-02-01 MED ORDER — ESCITALOPRAM OXALATE 5 MG PO TABS: 5.0000 mg | ORAL_TABLET | Freq: Every day | ORAL | 3 refills | Status: AC

## 2023-02-01 NOTE — Patient Instructions (Signed)
 I value your feedback and you entrusting [place] with your care. If you get a [facility] patient survey, I would appreciate you taking the time to let us know about your experience today. Thank you! ? ? ?

## 2023-02-11 ENCOUNTER — Other Ambulatory Visit: Payer: Self-pay | Admitting: Obstetrics and Gynecology

## 2023-02-11 DIAGNOSIS — F419 Anxiety disorder, unspecified: Secondary | ICD-10-CM

## 2023-02-11 DIAGNOSIS — N951 Menopausal and female climacteric states: Secondary | ICD-10-CM

## 2023-04-05 IMAGING — MG MM DIGITAL SCREENING BILAT W/ TOMO AND CAD
6 of 10 series · 6 of 30 positions shown · non-contrast
Comparison: Previous exam(s).

CLINICAL DATA: Screening.

EXAM:
DIGITAL SCREENING BILATERAL MAMMOGRAM WITH TOMOSYNTHESIS AND CAD
TECHNIQUE: Bilateral screening digital craniocaudal and mediolateral oblique
mammograms were obtained. Bilateral screening digital breast
tomosynthesis was performed. The images were evaluated with
computer-aided detection.

[R CC synth-2D]
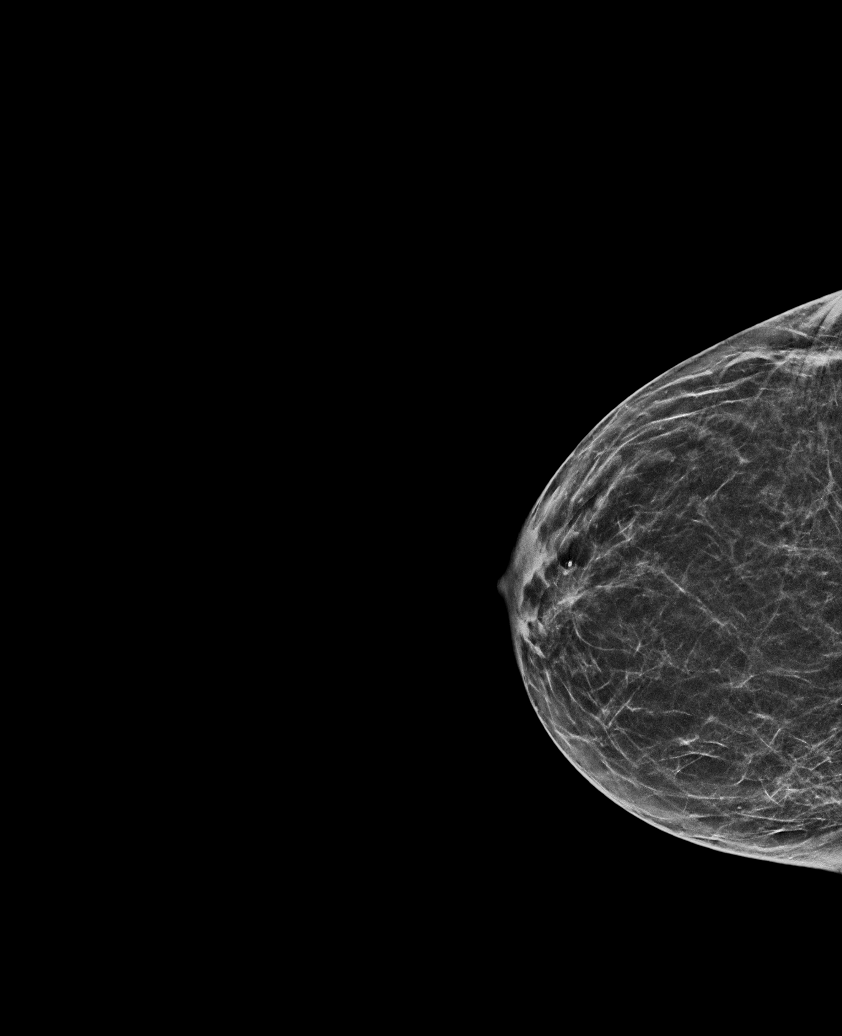

[R MLO synth-2D]
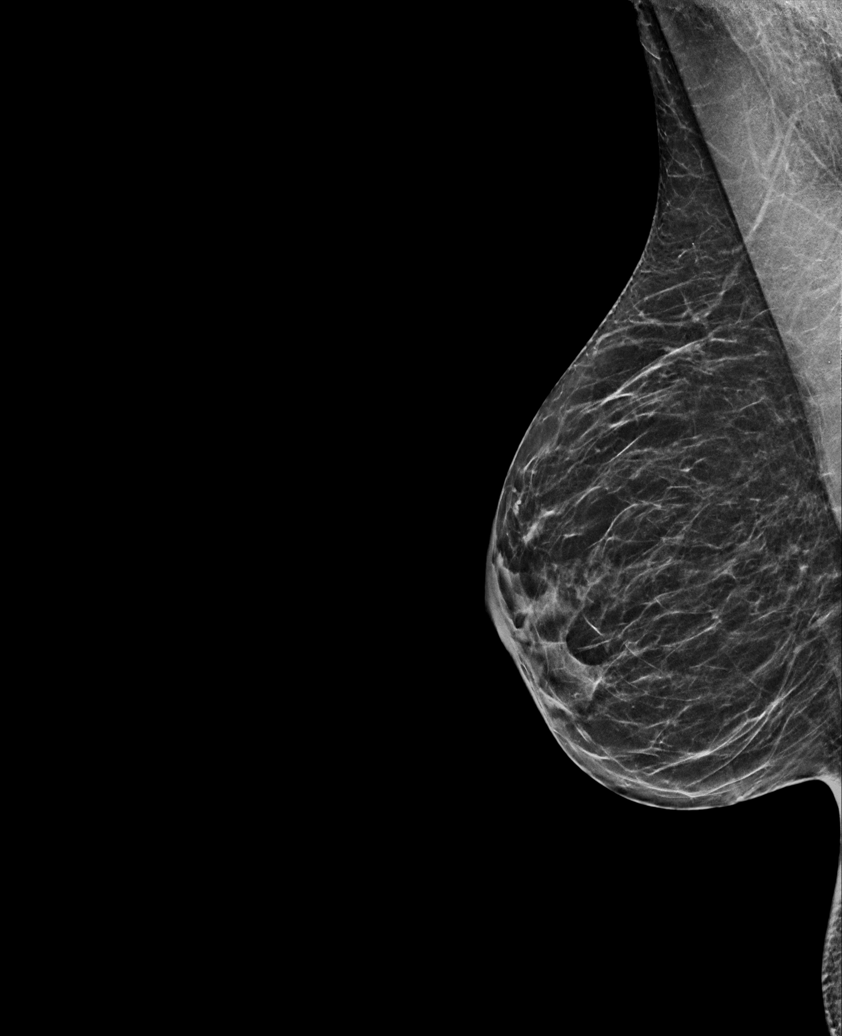

[L MLO synth-2D]
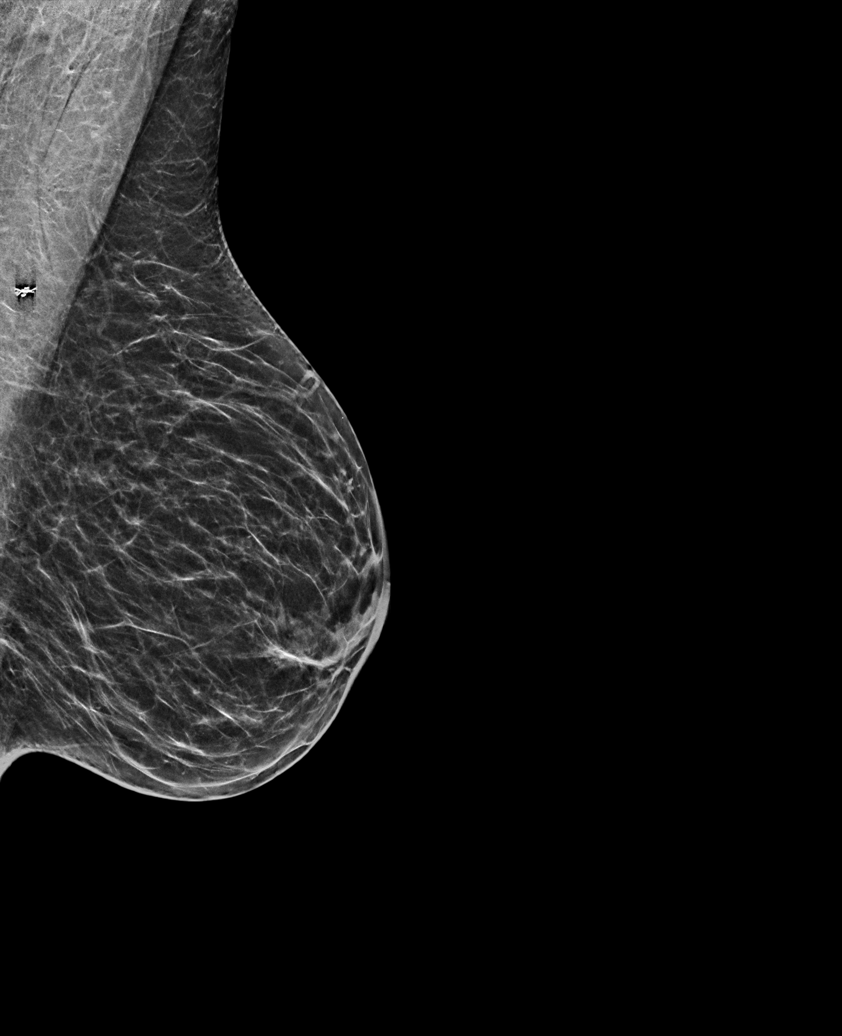

[L CC synth-2D (1 of 2)]
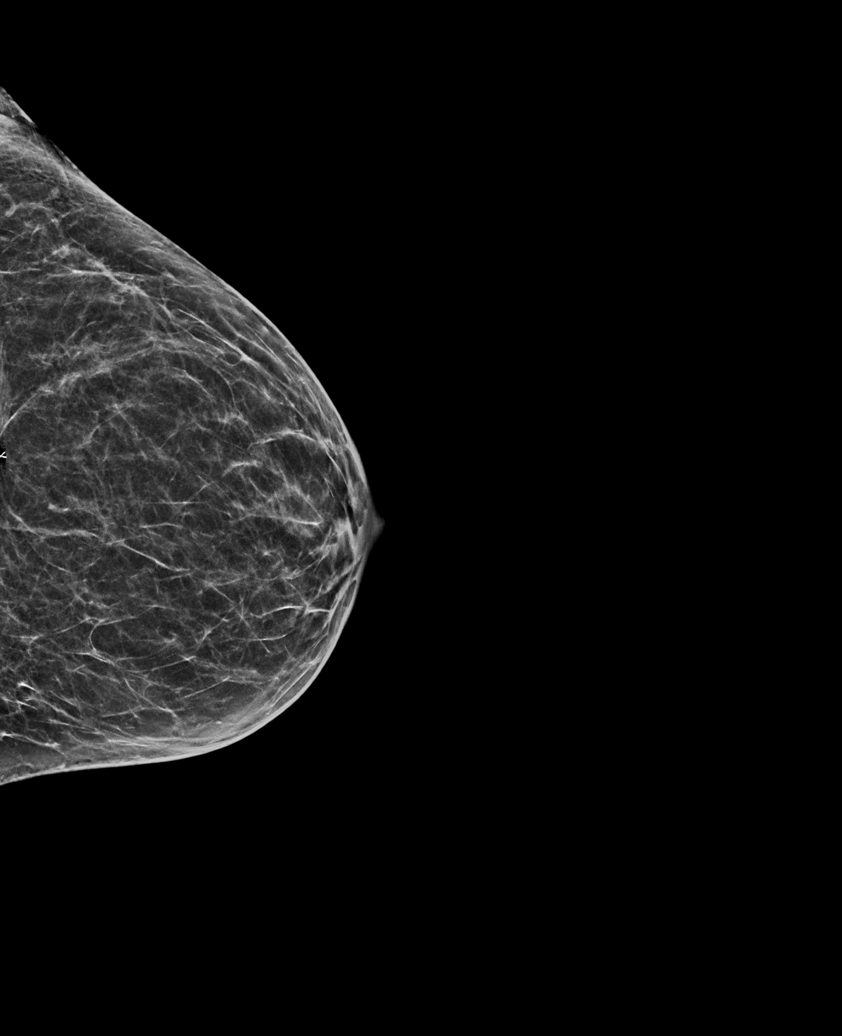

[L CC synth-2D (2 of 2)]
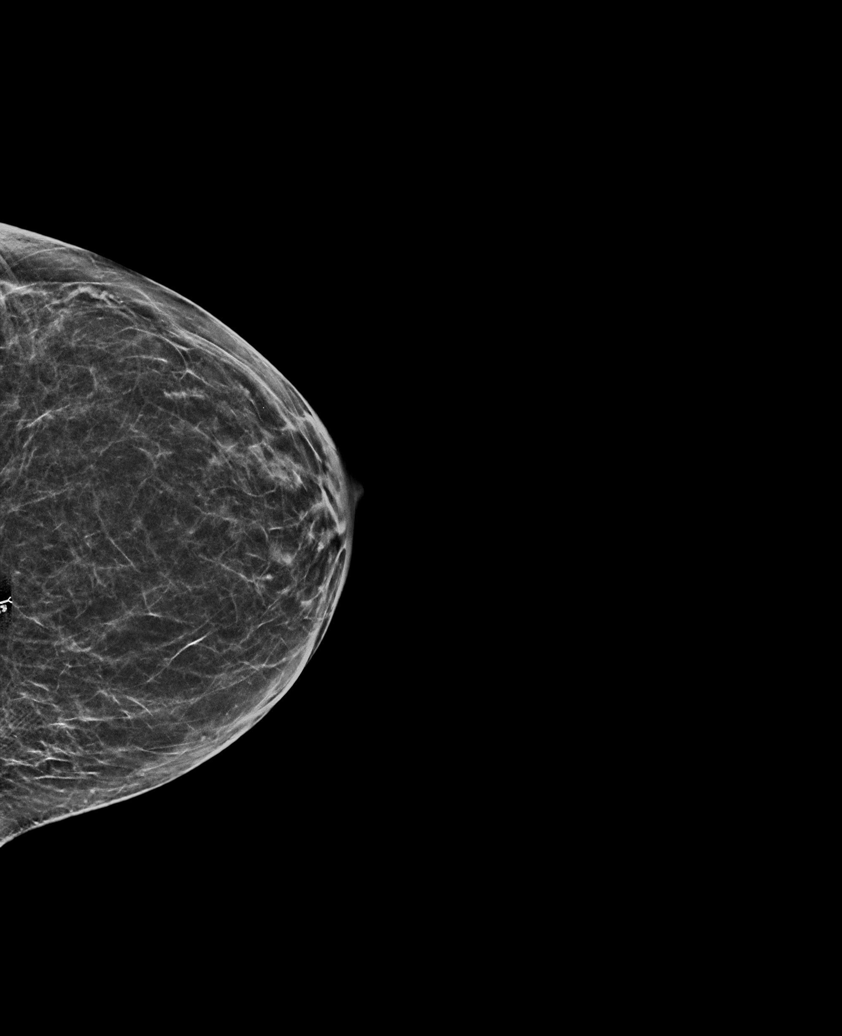

[L CC tomo · tomo slice 27/53.0]
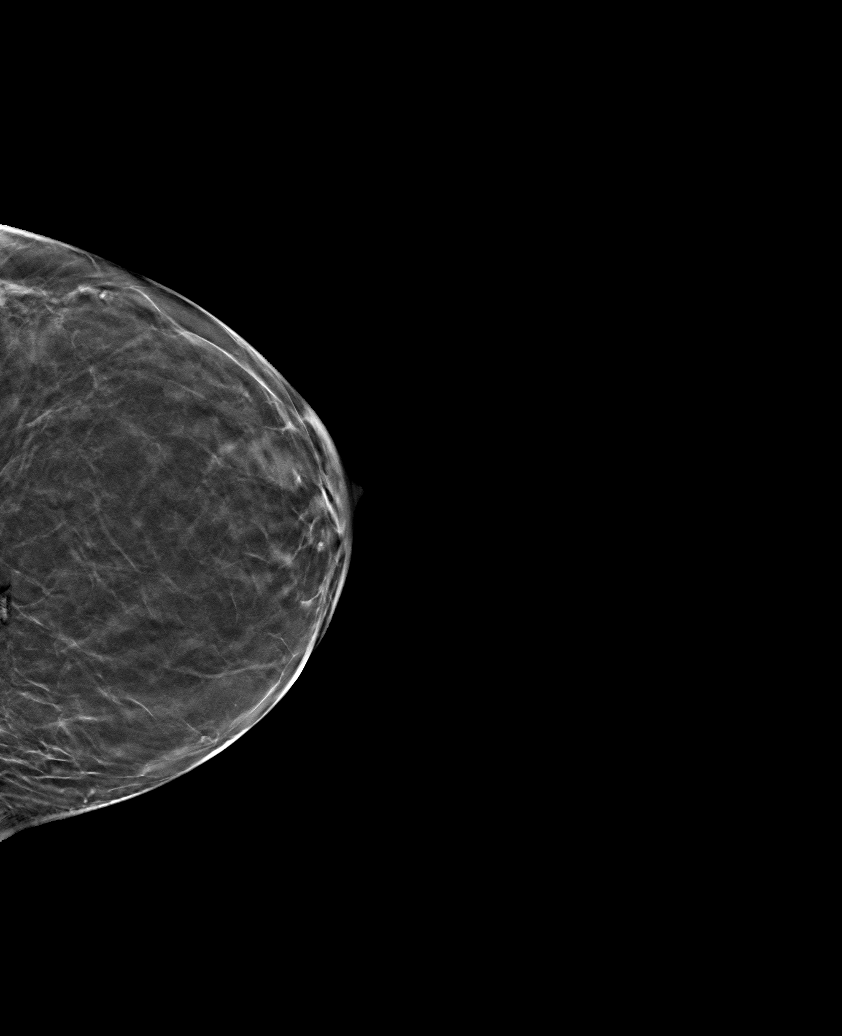

[6 of 30 positions shown; findings below may reference images not displayed]

ACR Breast Density Category b: There are scattered areas of
fibroglandular density.
FINDINGS: There are no findings suspicious for malignancy.
IMPRESSION: No mammographic evidence of malignancy. A result letter of this
screening mammogram will be mailed directly to the patient.

RECOMMENDATION:
Screening mammogram in one year. (Code:51-O-LD2)

BI-RADS CATEGORY  1: Negative.

## 2023-08-03 ENCOUNTER — Ambulatory Visit: Payer: Self-pay

## 2023-08-03 DIAGNOSIS — K5730 Diverticulosis of large intestine without perforation or abscess without bleeding: Secondary | ICD-10-CM | POA: Diagnosis not present

## 2023-08-03 DIAGNOSIS — Z1211 Encounter for screening for malignant neoplasm of colon: Secondary | ICD-10-CM | POA: Diagnosis present

## 2023-08-03 DIAGNOSIS — K640 First degree hemorrhoids: Secondary | ICD-10-CM | POA: Diagnosis not present

## 2023-08-03 DIAGNOSIS — K6289 Other specified diseases of anus and rectum: Secondary | ICD-10-CM | POA: Diagnosis not present
